# Patient Record
Sex: Female | Born: 1950 | Race: White | Hispanic: No | Smoking: Former smoker
Health system: Southern US, Community
[De-identification: ages and names within clinical notes are randomized; demographics above are authoritative.]

## PROBLEM LIST (undated history)

## (undated) DIAGNOSIS — F329 Major depressive disorder, single episode, unspecified: Secondary | ICD-10-CM

## (undated) DIAGNOSIS — R7303 Prediabetes: Secondary | ICD-10-CM

## (undated) DIAGNOSIS — E039 Hypothyroidism, unspecified: Secondary | ICD-10-CM

## (undated) DIAGNOSIS — D649 Anemia, unspecified: Secondary | ICD-10-CM

## (undated) DIAGNOSIS — F419 Anxiety disorder, unspecified: Secondary | ICD-10-CM

## (undated) DIAGNOSIS — I2510 Atherosclerotic heart disease of native coronary artery without angina pectoris: Secondary | ICD-10-CM

## (undated) DIAGNOSIS — E079 Disorder of thyroid, unspecified: Secondary | ICD-10-CM

## (undated) DIAGNOSIS — J189 Pneumonia, unspecified organism: Secondary | ICD-10-CM

## (undated) DIAGNOSIS — F32A Depression, unspecified: Secondary | ICD-10-CM

## (undated) DIAGNOSIS — I10 Essential (primary) hypertension: Secondary | ICD-10-CM

## (undated) DIAGNOSIS — I719 Aortic aneurysm of unspecified site, without rupture: Secondary | ICD-10-CM

## (undated) DIAGNOSIS — T7840XA Allergy, unspecified, initial encounter: Secondary | ICD-10-CM

## (undated) DIAGNOSIS — E782 Mixed hyperlipidemia: Secondary | ICD-10-CM

## (undated) DIAGNOSIS — R0600 Dyspnea, unspecified: Secondary | ICD-10-CM

## (undated) DIAGNOSIS — M1990 Unspecified osteoarthritis, unspecified site: Secondary | ICD-10-CM

## (undated) DIAGNOSIS — K5792 Diverticulitis of intestine, part unspecified, without perforation or abscess without bleeding: Secondary | ICD-10-CM

## (undated) HISTORY — PX: COLONOSCOPY: SHX174

## (undated) HISTORY — DX: Prediabetes: R73.03

## (undated) HISTORY — DX: Atherosclerotic heart disease of native coronary artery without angina pectoris: I25.10

## (undated) HISTORY — DX: Essential (primary) hypertension: I10

## (undated) HISTORY — DX: Aortic aneurysm of unspecified site, without rupture: I71.9

## (undated) HISTORY — DX: Allergy, unspecified, initial encounter: T78.40XA

## (undated) HISTORY — DX: Hypothyroidism, unspecified: E03.9

## (undated) HISTORY — PX: BACK SURGERY: SHX140

## (undated) HISTORY — DX: Diverticulitis of intestine, part unspecified, without perforation or abscess without bleeding: K57.92

## (undated) HISTORY — PX: TOE SURGERY: SHX1073

## (undated) HISTORY — PX: CARPAL TUNNEL RELEASE: SHX101

## (undated) HISTORY — DX: Mixed hyperlipidemia: E78.2

## (undated) HISTORY — PX: TONSILLECTOMY: SUR1361

## (undated) HISTORY — DX: Disorder of thyroid, unspecified: E07.9

## (undated) HISTORY — PX: OTHER SURGICAL HISTORY: SHX169

---
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
## 2017-05-27 ENCOUNTER — Other Ambulatory Visit: Payer: Self-pay

## 2017-05-27 ENCOUNTER — Encounter: Payer: Self-pay | Admitting: Vascular Surgery

## 2017-05-27 ENCOUNTER — Ambulatory Visit: Payer: Medicare HMO | Admitting: Vascular Surgery

## 2017-05-27 VITALS — BP 124/83 | HR 80 | Temp 97.8°F | Resp 14 | Ht 66.0 in | Wt 202.0 lb

## 2017-05-27 DIAGNOSIS — I714 Abdominal aortic aneurysm, without rupture, unspecified: Secondary | ICD-10-CM

## 2017-05-27 DIAGNOSIS — I713 Abdominal aortic aneurysm, ruptured, unspecified: Secondary | ICD-10-CM

## 2017-05-27 HISTORY — DX: Abdominal aortic aneurysm, without rupture: I71.4

## 2017-05-27 HISTORY — DX: Abdominal aortic aneurysm, without rupture, unspecified: I71.40

## 2017-05-27 NOTE — Progress Notes (Signed)
Patient ID: Cathy KoyanagiMary Haney, female   DOB: [birth year], 67 y.o.   MRN: [id number]  Reason for Consult: New Patient (Initial Visit) (AAA)   Referred by Maris BergerSistasis, Rowena, MD  Subjective:     HPI:  Cathy KoyanagiMary Haney is a 67 y.o. female with history of back pain she has recently had injections.  She also has right upper quadrant pain underwent abdominal ultrasound was found to have a 5 cm aneurysm for which she is now referred.  She does not have any new back or abdominal pain.  She does have a sister with a history of aneurysm and another sister with a cerebral aneurysm.  She is a former smoker for many years having quit this January.  She is able to walk without limitation.  She currently lives alone.  She does not take blood thinners.  She has never had coronary artery disease diagnosed.  Past Medical History:  Diagnosis Date  . Diverticulitis   . Thyroid disease    Family History  Problem Relation Age of Onset  . Heart disease Mother   . Diabetes Brother    Past Surgical History:  Procedure Laterality Date  . BACK SURGERY     khyphoplasty  . CARPAL TUNNEL RELEASE    . COLONOSCOPY      Short Social History:  Social History   Tobacco Use  . Smoking status: Current Every Day Smoker    Types: Cigarettes  . Smokeless tobacco: Never Used  . Tobacco comment: 6 cigs a day  Substance Use Topics  . Alcohol use: No    Frequency: Never    Allergies  Allergen Reactions  . Other     Current Outpatient Medications  Medication Sig Dispense Refill  . albuterol (VENTOLIN HFA) 108 (90 Base) MCG/ACT inhaler Inhale into the lungs every 6 (six) hours as needed for wheezing or shortness of breath.    Marland Kitchen. alendronate (FOSAMAX) 70 MG tablet Take 70 mg by mouth once a week. Take with a full glass of water on an empty stomach.    Marland Kitchen. aspirin EC 81 MG tablet Take 81 mg by mouth daily.    . carvedilol (COREG) 6.25 MG tablet Take 6.25 mg by mouth 2 (two) times daily with a meal.    . fluticasone (FLONASE)  50 MCG/ACT nasal spray Place into both nostrils daily.    Marland Kitchen. gabapentin (NEURONTIN) 300 MG capsule Take 300 mg by mouth 3 (three) times daily.    Marland Kitchen. levothyroxine (SYNTHROID, LEVOTHROID) 125 MCG tablet Take 125 mcg by mouth daily before breakfast.    . lisinopril (PRINIVIL,ZESTRIL) 10 MG tablet Take 10 mg by mouth daily.    . pravastatin (PRAVACHOL) 40 MG tablet Take 40 mg by mouth daily.    . ranitidine (ZANTAC) 150 MG tablet Take 150 mg by mouth 2 (two) times daily.    . hydrOXYzine (ATARAX/VISTARIL) 25 MG tablet Take 25 mg by mouth 3 (three) times daily as needed.    Marland Kitchen. levofloxacin (LEVAQUIN) 750 MG tablet Take 750 mg by mouth daily.    Marland Kitchen. nystatin (MYCOSTATIN) 100000 UNIT/ML suspension Take 5 mLs by mouth 4 (four) times daily.    . predniSONE (DELTASONE) 20 MG tablet Take 20 mg by mouth daily with breakfast.    . triamcinolone acetonide (KENALOG) 10 MG/ML injection Inject 40 mg into the muscle once.     No current facility-administered medications for this visit.     Review of Systems  Constitutional:  Constitutional negative. HENT: HENT negative.  Eyes: Eyes negative.  Respiratory: Respiratory negative.  Cardiovascular: Cardiovascular negative.  GI: Gastrointestinal negative.  Musculoskeletal: Musculoskeletal negative.  Skin: Skin negative.  Neurological: Neurological negative. Hematologic: Hematologic/lymphatic negative.  Psychiatric: Psychiatric negative.        Objective:  Objective   Vitals:   05/27/17 1307  BP: 124/83  Pulse: 80  Resp: 14  Temp: 97.8 F (36.6 C)  TempSrc: Oral  SpO2: 95%  Weight: 202 lb (91.6 kg)  Height: 5\' 6"  (1.676 m)   Body mass index is 32.6 kg/m.  Physical Exam  Constitutional: She is oriented to person, place, and time. She appears well-developed.  HENT:  Head: Normocephalic.  Eyes: Pupils are equal, round, and reactive to light.  Neck: Normal range of motion.  Cardiovascular: Normal rate.  Pulses:      Carotid pulses are 2+ on  the right side, and 2+ on the left side.      Radial pulses are 2+ on the right side, and 2+ on the left side.       Femoral pulses are 2+ on the right side, and 2+ on the left side.      Posterior tibial pulses are 2+ on the right side, and 2+ on the left side.  Abdominal: Soft. She exhibits no mass.  Musculoskeletal: Normal range of motion.  Neurological: She is alert and oriented to person, place, and time.  Skin: Skin is warm and dry.  Psychiatric: She has a normal mood and affect. Her behavior is normal. Judgment and thought content normal.    Data: I reviewed her images of her ultrasound from outside which demonstrates a 5 cm aneurysm at the bifurcation.  There is also an MRI for review of her thoracic spine which demonstrates that her aorta is of normal caliber in the chest.     Assessment/Plan:     67 year old female presenting with a 5 cm distal aortic aneurysm.  She does not have a new back or abdominal pain that we did discuss the signs and symptoms of rupture and the likely outcomes.  She is in good understanding.  We discussed the possible options for repair and the size of which she would require repair being greater than 5 cm where she would transition to a 5-10% yearly risk of rupture.  We discussed the options of open versus endovascular repair and that we will need CT scan prior.  We will get that and also have her evaluated by cardiology and have her follow-up after both.  Should she develop signs or symptoms of rupture she will seek medical attention emergently.     [name] MD [facility]

## 2017-05-30 DIAGNOSIS — K5792 Diverticulitis of intestine, part unspecified, without perforation or abscess without bleeding: Secondary | ICD-10-CM | POA: Insufficient documentation

## 2017-05-30 DIAGNOSIS — I10 Essential (primary) hypertension: Secondary | ICD-10-CM

## 2017-05-30 DIAGNOSIS — E079 Disorder of thyroid, unspecified: Secondary | ICD-10-CM | POA: Insufficient documentation

## 2017-05-30 DIAGNOSIS — I719 Aortic aneurysm of unspecified site, without rupture: Secondary | ICD-10-CM

## 2017-05-30 HISTORY — DX: Essential (primary) hypertension: I10

## 2017-05-30 HISTORY — DX: Aortic aneurysm of unspecified site, without rupture: I71.9

## 2017-06-01 DIAGNOSIS — Z01810 Encounter for preprocedural cardiovascular examination: Secondary | ICD-10-CM | POA: Insufficient documentation

## 2017-06-01 HISTORY — DX: Encounter for preprocedural cardiovascular examination: Z01.810

## 2017-06-01 NOTE — Progress Notes (Signed)
Cardiology Office Note:    Date:  06/02/2017   ID:  Terressa Koyanagi, DOB 15-Mar-1951, MRN [id number]  PCP:  Maris Berger, MD  Cardiologist:  Norman Herrlich, MD   Referring MD: Vicki Mallet*  ASSESSMENT:    1. Preoperative cardiovascular examination   2. Benign essential hypertension   3. Abdominal aortic aneurysm (AAA) without rupture [facility])    PLAN:    Preoperative cardiovascular evaluation summary Surgeon: Dr Randie Heinz Procedure: AAA repair The surgery is elective Active cardiac problems  None.  The cardiac status is stable. The planned procedure is intermediate-EVAR to high-open surgery reepair -risk. The cardiac risk factors are hypertension The functional capacity is 4 mets or greater  5-6 mets limited with back pain Recent cardiac tests performed EKG today is normal Given the above his overall risk for the planned procedure is intermediate if endovascular and high if open surgery Antiplatelet/ anticoagulant recommendation: Stop aspirin perioperatively please tell with patient when to resume Other cardiac medication or device recommendation: None, continue usual cardiac medications perioperatively Anesthesia recommendation: None Observation, monitoring,and postoperative test recommendation: Please placed on monitored bed for the first 24 hours and check EKG postoperative day 1 The patient is optimized from a cardiology perspective: No, pending results of myocardial perfusion study and I will place an addendum to her note  Greater than 50% of the 60 minutes was spent in counseling/coordiantion of care regarding assessing cardiovascular status and perioperative recommendations.  In order of problems listed above:  1. See above myocardial perfusion study be performed unless she has high risk markers proceed with her planned surgery 2. Stable continue current treatment ACE inhibitor and a beta blocker 3. Await results of myocardial perfusion study prior to either  endovascular or open surgical repair 4. Stable continue her current thyroid supplement  Next appointment as needed   Medication Adjustments/Labs and Tests Ordered: Current medicines are reviewed at length with the patient today.  Concerns regarding medicines are outlined above.  Orders Placed This Encounter  Procedures  . EKG 12-Lead   No orders of the defined types were placed in this encounter.    Chief Complaint  Patient presents with  . Hospitalization Follow-up    History of Present Illness:    Cathy Haney is a 67 y.o. female who is being seen today for the evaluation at the request of Vicki Mallet*.  67 year old female presenting with a 5 cm distal aortic aneurysm.  She does not have a new back or abdominal pain that we did discuss the signs and symptoms of rupture and the likely outcomes.  She is in good understanding.  We discussed the possible options for repair and the size of which she would require repair being greater than 5 cm where she would transition to a 5-10% yearly risk of rupture.  We discussed the options of open versus endovascular repair and that we will need CT scan prior.  We will get that and also have her evaluated by cardiology and have her follow-up after both.  Should she develop signs or symptoms of rupture she will seek medical attention emergently. [name] MD [facility] Despite Chronic back pain she remains active she can walk around [place] parking parking lot walking and out without limitation.  She finds herself breathless climbing stairs and has smoked for up to 45 years recently stopping.  She has no cough or bronchospasm she has had no chest pain palpitation or syncope.  Estimated exercise tolerance 6-7  mets.  She relates that decades ago she had a stress test that was normal.  She has had anesthesia in the last year back surgery without complication she has no history of venous  thromboembolism or antibiotic resistant organism infection  Past Medical History:  Diagnosis Date  . Aortic aneurysm of unspecified site, without rupture (HCC) 05/30/2017  . Benign essential hypertension 05/30/2017  . Diverticulitis   . Thyroid disease     Past Surgical History:  Procedure Laterality Date  . BACK SURGERY     khyphoplasty  . CARPAL TUNNEL RELEASE    . COLONOSCOPY      Current Medications: Current Meds  Medication Sig  . albuterol (VENTOLIN HFA) 108 (90 Base) MCG/ACT inhaler Inhale into the lungs every 6 (six) hours as needed for wheezing or shortness of breath.  Marland Kitchen alendronate (FOSAMAX) 70 MG tablet Take 70 mg by mouth once a week. Take with a full glass of water on an empty stomach.  Marland Kitchen aspirin EC 81 MG tablet Take 81 mg by mouth daily.  . carvedilol (COREG) 6.25 MG tablet Take 6.25 mg by mouth 2 (two) times daily with a meal.  . fluticasone (FLONASE) 50 MCG/ACT nasal spray Place into both nostrils daily.  Marland Kitchen gabapentin (NEURONTIN) 300 MG capsule Take 300 mg by mouth 3 (three) times daily.  Marland Kitchen levothyroxine (SYNTHROID, LEVOTHROID) 125 MCG tablet Take 125 mcg by mouth daily before breakfast.  . lisinopril (PRINIVIL,ZESTRIL) 10 MG tablet Take 10 mg by mouth daily.  . pravastatin (PRAVACHOL) 40 MG tablet Take 40 mg by mouth daily.  . ranitidine (ZANTAC) 150 MG tablet Take 150 mg by mouth 2 (two) times daily.     Allergies:   Other   Social History   Socioeconomic History  . Marital status: Widowed    Spouse name: None  . Number of children: None  . Years of education: None  . Highest education level: None  Social Needs  . Financial resource strain: None  . Food insecurity - worry: None  . Food insecurity - inability: None  . Transportation needs - medical: None  . Transportation needs - non-medical: None  Occupational History  . None  Tobacco Use  . Smoking status: Former Smoker    Types: Cigarettes  . Smokeless tobacco: Never Used  . Tobacco comment: 6  cigs a day  Substance and Sexual Activity  . Alcohol use: No    Frequency: Never  . Drug use: No  . Sexual activity: No  Other Topics Concern  . None  Social History Narrative  . None     Family History: The patient's family history includes Diabetes in her brother; Heart disease in her mother.  ROS:   Review of Systems  Constitution: Negative.  HENT: Negative.   Eyes: Negative.   Cardiovascular: Negative.   Respiratory: Positive for shortness of breath (on stairs or incline).   Endocrine: Negative.   Hematologic/Lymphatic: Negative.   Skin: Negative.   Musculoskeletal: Positive for back pain.  Gastrointestinal: Negative.   Genitourinary: Negative.   Neurological: Negative.   Psychiatric/Behavioral: Negative.   Allergic/Immunologic: Negative.    Please see the history of present illness.     All other systems reviewed and are negative.  EKGs/Labs/Other Studies Reviewed:    The following studies were reviewed today:   EKG:  EKG is  ordered today.  The ekg ordered today demonstrates SRTH normal  Recent Labs: 11/28 18: Chol 167, HDL 43 LDL 92 CMP CBC T4 normal A1C  6.9 No results found for requested labs within last 8760 hours.  Recent Lipid Panel No results found for: CHOL, TRIG, HDL, CHOLHDL, VLDL, LDLCALC, LDLDIRECT  Physical Exam:    VS:  BP 140/90 (BP Location: Left Arm)   Pulse 76   Ht $RemoveBeforeD [id number]\' 6"   BMI 33.15 kg/m     Wt Readings from Last 3 Encounters:  06/02/17 205 lb 6.4 oz (93.2 kg)  05/27/17 202 lb (91.6 kg)     GEN:  Well nourished, well developed in no acute distress HEENT: Normal NECK: No JVD; No carotid bruits LYMPHATICS: No lymphadenopathy CARDIAC: RRR, no murmurs, rubs, gallops RESPIRATORY:  Clear to auscultation without rales, wheezing or rhonchi  ABDOMEN: Soft, non-tender, non-distended MUSCULOSKELETAL:  No edema; No deformity  SKIN: Warm and dry NEUROLOGIC:  Alert and oriented x  3 PSYCHIATRIC:  Normal affect   Signed, Norman HerrlichBrian Munley, MD  06/02/2017 11:59 AM    [facility]

## 2017-06-02 ENCOUNTER — Encounter: Payer: Self-pay | Admitting: Cardiology

## 2017-06-02 ENCOUNTER — Ambulatory Visit (INDEPENDENT_AMBULATORY_CARE_PROVIDER_SITE_OTHER): Payer: Medicare HMO | Admitting: Cardiology

## 2017-06-02 VITALS — BP 140/90 | HR 76 | Ht 66.0 in | Wt 205.4 lb

## 2017-06-02 DIAGNOSIS — I10 Essential (primary) hypertension: Secondary | ICD-10-CM | POA: Diagnosis not present

## 2017-06-02 DIAGNOSIS — Z01810 Encounter for preprocedural cardiovascular examination: Secondary | ICD-10-CM | POA: Diagnosis not present

## 2017-06-02 DIAGNOSIS — E079 Disorder of thyroid, unspecified: Secondary | ICD-10-CM

## 2017-06-02 DIAGNOSIS — I714 Abdominal aortic aneurysm, without rupture, unspecified: Secondary | ICD-10-CM

## 2017-06-02 NOTE — Patient Instructions (Signed)
Medication Instructions:  Your physician recommends that you continue on your current medications as directed. Please refer to the Current Medication list given to you today.  Labwork: None  Testing/Procedures: You had an EKG today.  Your physician has requested that you have a lexiscan myoview. For further information please visit [email or web address]. Please follow instruction sheet, as given.  Follow-Up: Your physician recommends that you schedule a follow-up appointment as needed if symptoms worsen or fail to improve.  Any Other Special Instructions Will Be Listed Below (If Applicable).     If you need a refill on your cardiac medications before your next appointment, please call your pharmacy.   

## 2017-06-06 ENCOUNTER — Encounter (HOSPITAL_COMMUNITY): Payer: Medicare HMO

## 2017-06-08 ENCOUNTER — Telehealth (HOSPITAL_COMMUNITY): Payer: Self-pay | Admitting: *Deleted

## 2017-06-08 NOTE — Telephone Encounter (Signed)
Left message on voicemail per DPR in reference to upcoming appointment scheduled on 06/13/17 with detailed instructions given per Myocardial Perfusion Study Information Sheet for the test. LM to arrive 15 minutes early, and that it is imperative to arrive on time for appointment to keep from having the test rescheduled. If you need to cancel or reschedule your appointment, please call the office within 24 hours of your appointment. Failure to do so may result in a cancellation of your appointment, and a $50 no show fee. Phone number given for call back for any questions. Cerise Lieber, Darla Jacqueline    

## 2017-06-13 ENCOUNTER — Ambulatory Visit (HOSPITAL_COMMUNITY): Payer: Medicare HMO | Attending: Cardiovascular Disease

## 2017-06-13 DIAGNOSIS — Z01810 Encounter for preprocedural cardiovascular examination: Secondary | ICD-10-CM | POA: Insufficient documentation

## 2017-06-13 DIAGNOSIS — I10 Essential (primary) hypertension: Secondary | ICD-10-CM | POA: Insufficient documentation

## 2017-06-13 DIAGNOSIS — I714 Abdominal aortic aneurysm, without rupture, unspecified: Secondary | ICD-10-CM

## 2017-06-13 LAB — MYOCARDIAL PERFUSION IMAGING
CHL CUP RESTING HR STRESS: 72 {beats}/min
CSEPPHR: 94 {beats}/min
LV dias vol: 77 mL (ref 46–106)
LVSYSVOL: 25 mL
RATE: 0.31
SDS: 1
SRS: 4
SSS: 5
TID: 0.97

## 2017-06-13 MED ORDER — REGADENOSON 0.4 MG/5ML IV SOLN
0.4000 mg | Freq: Once | INTRAVENOUS | Status: AC
  Administered 2017-06-13: 0.4 mg via INTRAVENOUS

## 2017-06-13 MED ORDER — TECHNETIUM TC 99M TETROFOSMIN IV KIT
10.6000 | PACK | Freq: Once | INTRAVENOUS | Status: AC | PRN
  Administered 2017-06-13: 10.6 via INTRAVENOUS
  Filled 2017-06-13: qty 11

## 2017-06-13 MED ORDER — TECHNETIUM TC 99M TETROFOSMIN IV KIT
31.0000 | PACK | Freq: Once | INTRAVENOUS | Status: AC | PRN
  Administered 2017-06-13: 31 via INTRAVENOUS
  Filled 2017-06-13: qty 31

## 2017-06-17 ENCOUNTER — Other Ambulatory Visit: Payer: Self-pay

## 2017-06-17 ENCOUNTER — Encounter: Payer: Self-pay | Admitting: Vascular Surgery

## 2017-06-17 ENCOUNTER — Ambulatory Visit (INDEPENDENT_AMBULATORY_CARE_PROVIDER_SITE_OTHER): Payer: Medicare HMO | Admitting: Vascular Surgery

## 2017-06-17 ENCOUNTER — Ambulatory Visit
Admission: RE | Admit: 2017-06-17 | Discharge: 2017-06-17 | Disposition: A | Discharge status: Home / Self Care | Payer: Medicare HMO | Source: Ambulatory Visit | Attending: Vascular Surgery | Admitting: Vascular Surgery

## 2017-06-17 VITALS — BP 160/87 | HR 84 | Resp 18 | Ht 66.0 in | Wt 206.0 lb

## 2017-06-17 DIAGNOSIS — I714 Abdominal aortic aneurysm, without rupture, unspecified: Secondary | ICD-10-CM

## 2017-06-17 DIAGNOSIS — I713 Abdominal aortic aneurysm, ruptured, unspecified: Secondary | ICD-10-CM

## 2017-06-17 MED ORDER — IOPAMIDOL (ISOVUE-370) INJECTION 76%
75.0000 mL | Freq: Once | INTRAVENOUS | Status: AC | PRN
  Administered 2017-06-17: 75 mL via INTRAVENOUS

## 2017-06-17 NOTE — Progress Notes (Signed)
Patient ID: Cathy KoyanagiMary Haney, female   DOB: [birth year], 67 y.o.   MRN: [id number]  Reason for Consult: Follow-up (CT w/ cardiology clearance)   Referred by Maris BergerSistasis, Rowena, MD  Subjective:     HPI:  Cathy Haney is a 67 y.o. female whom I recently saw for 5 cm aneurysm.  She does not have any back or abdominal pain.  She remains a non-smoker and she does continue to walk without limitation.  She had CT scan and cardiac evaluation prior to today's visit.  Past Medical History:  Diagnosis Date  . Aortic aneurysm of unspecified site, without rupture (HCC) 05/30/2017  . Benign essential hypertension 05/30/2017  . Diverticulitis   . Thyroid disease    Family History  Problem Relation Age of Onset  . Heart disease Mother   . Diabetes Brother   . Heart disease Brother    Past Surgical History:  Procedure Laterality Date  . BACK SURGERY     khyphoplasty  . CARPAL TUNNEL RELEASE    . COLONOSCOPY      Short Social History:  Social History   Tobacco Use  . Smoking status: Former Smoker    Types: Cigarettes  . Smokeless tobacco: Never Used  . Tobacco comment: 6 cigs a day  Substance Use Topics  . Alcohol use: No    Frequency: Never    Allergies  Allergen Reactions  . Other     Current Outpatient Medications  Medication Sig Dispense Refill  . albuterol (VENTOLIN HFA) 108 (90 Base) MCG/ACT inhaler Inhale into the lungs every 6 (six) hours as needed for wheezing or shortness of breath.    Marland Kitchen. alendronate (FOSAMAX) 70 MG tablet Take 70 mg by mouth once a week. Take with a full glass of water on an empty stomach.    Marland Kitchen. aspirin EC 81 MG tablet Take 81 mg by mouth daily.    . carvedilol (COREG) 6.25 MG tablet Take 6.25 mg by mouth 2 (two) times daily with a meal.    . fluticasone (FLONASE) 50 MCG/ACT nasal spray Place into both nostrils daily.    Marland Kitchen. gabapentin (NEURONTIN) 300 MG capsule Take 300 mg by mouth 3 (three) times daily.    Marland Kitchen. levothyroxine (SYNTHROID, LEVOTHROID) 125 MCG tablet  Take 125 mcg by mouth daily before breakfast.    . lisinopril (PRINIVIL,ZESTRIL) 10 MG tablet Take 10 mg by mouth daily.    . pravastatin (PRAVACHOL) 40 MG tablet Take 40 mg by mouth daily.    . ranitidine (ZANTAC) 150 MG tablet Take 150 mg by mouth 2 (two) times daily.     No current facility-administered medications for this visit.     Review of Systems  Constitutional:  Constitutional negative. HENT: HENT negative.  Eyes: Eyes negative.  Cardiovascular: Cardiovascular negative.  GI: Gastrointestinal negative.  Skin: Skin negative.  Neurological: Neurological negative. Hematologic: Hematologic/lymphatic negative.  Psychiatric: Psychiatric negative.        Objective:  Objective   Vitals:   06/17/17 1521 06/17/17 1522  BP: (!) 164/88 (!) 160/87  Pulse: 84   Resp: 18   SpO2: 96%   Weight: 206 lb (93.4 kg)   Height: 5\' 6"  (1.676 m)    Body mass index is 33.25 kg/m.  Physical Exam  Constitutional: She is oriented to person, place, and time. She appears well-developed.  HENT:  Head: Normocephalic.  Cardiovascular: Normal rate.  Pulses:      Radial pulses are 2+ on the right side, and 2+ on the  left side.       Popliteal pulses are 2+ on the right side, and 2+ on the left side.  Pulmonary/Chest: Effort normal.  Abdominal: Soft.  Neurological: She is alert and oriented to person, place, and time.  Skin: Skin is warm and dry.  Psychiatric: She has a normal mood and affect. Her behavior is normal. Judgment and thought content normal.    Data: IMPRESSION: VASCULAR  Abdominal aortic aneurysm is present with a maximal diameter of 4.7 cm. Recommend followup by abdomen and pelvis CTA in 6 months, and vascular surgery referral/consultation if not already obtained. This recommendation follows ACR consensus guidelines: White Paper of the ACR Incidental Findings Committee II on Vascular Findings. J Am Coll Radiol 2013; 10:789-794.     Assessment/Plan:     67 year old  female follows up for surgical planning of abdominal aortic aneurysm.  Fortunately her CT angios demonstrated her aorta to clearly be less than 5 cm as it was demonstrated to be greater than 5 cm on our duplex.  She did have cardiac testing and cleared for surgery but at this time she will need follow-up in 6 months and no surgery. Given the incongruent studies we will continue to get CT scan to follow this at least in 6 months from now.  After that we may go back to duplex if it is stable.  She demonstrates good understanding.  Should she have new back or abdominal pain she certainly needs to seek medical evaluation.     [name] MD [facility]

## 2017-06-20 ENCOUNTER — Other Ambulatory Visit: Payer: Self-pay

## 2017-06-20 DIAGNOSIS — I714 Abdominal aortic aneurysm, without rupture, unspecified: Secondary | ICD-10-CM

## 2017-12-23 ENCOUNTER — Other Ambulatory Visit: Payer: Medicare HMO

## 2017-12-23 ENCOUNTER — Ambulatory Visit: Payer: Medicare HMO | Admitting: Vascular Surgery

## 2018-01-13 ENCOUNTER — Ambulatory Visit
Admission: RE | Admit: 2018-01-13 | Discharge: 2018-01-13 | Disposition: A | Discharge status: Home / Self Care | Payer: Medicare HMO | Source: Ambulatory Visit | Attending: Vascular Surgery | Admitting: Vascular Surgery

## 2018-01-13 ENCOUNTER — Ambulatory Visit (INDEPENDENT_AMBULATORY_CARE_PROVIDER_SITE_OTHER): Payer: Medicare HMO | Admitting: Vascular Surgery

## 2018-01-13 ENCOUNTER — Other Ambulatory Visit: Payer: Self-pay

## 2018-01-13 ENCOUNTER — Encounter: Payer: Self-pay | Admitting: Vascular Surgery

## 2018-01-13 VITALS — BP 126/80 | HR 70 | Resp 18 | Ht 66.0 in | Wt 198.0 lb

## 2018-01-13 DIAGNOSIS — I714 Abdominal aortic aneurysm, without rupture, unspecified: Secondary | ICD-10-CM

## 2018-01-13 MED ORDER — IOPAMIDOL (ISOVUE-370) INJECTION 76%
75.0000 mL | Freq: Once | INTRAVENOUS | Status: AC | PRN
  Administered 2018-01-13: 75 mL via INTRAVENOUS

## 2018-01-13 NOTE — Progress Notes (Signed)
Patient ID: Cathy Haney, female   DOB: 1951/03/22, 67 y.o.   MRN: [id number]  Reason for Consult: Follow-up (6 month f/u )   Referred by Cathy Berger, MD  Subjective:     HPI:  Cathy Haney is a 66 y.o. female with known abdominal aortic aneurysm.  Initially was thought to be 5 cm but CT scan demonstrated 4.7 cm.  She does have a family history of abdominal aortic aneurysm and is also a former smoker.  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  She is being considered for colonoscopy and also for injections in her back and is not considering back surgery at this time.  Past Medical History:  Diagnosis Date  . Aortic aneurysm of unspecified site, without rupture (HCC) 05/30/2017  . Benign essential hypertension 05/30/2017  . Diverticulitis   . Thyroid disease    Family History  Problem Relation Age of Onset  . Heart disease Mother   . Diabetes Brother   . Heart disease Brother    Past Surgical History:  Procedure Laterality Date  . BACK SURGERY     khyphoplasty  . CARPAL TUNNEL RELEASE    . COLONOSCOPY      Short Social History:  Social History   Tobacco Use  . Smoking status: Former Smoker    Types: Cigarettes  . Smokeless tobacco: Never Used  . Tobacco comment: 6 cigs a day  Substance Use Topics  . Alcohol use: No    Frequency: Never    Allergies  Allergen Reactions  . Other     Current Outpatient Medications  Medication Sig Dispense Refill  . albuterol (VENTOLIN HFA) 108 (90 Base) MCG/ACT inhaler Inhale into the lungs every 6 (six) hours as needed for wheezing or shortness of breath.    Marland Kitchen alendronate (FOSAMAX) 70 MG tablet Take 70 mg by mouth once a week. Take with a full glass of water on an empty stomach.    Marland Kitchen aspirin EC 81 MG tablet Take 81 mg by mouth daily.    . carvedilol (COREG) 6.25 MG tablet Take 6.25 mg by mouth 2 (two) times daily with a meal.    . fluticasone (FLONASE) 50 MCG/ACT  nasal spray Place into both nostrils daily.    Marland Kitchen gabapentin (NEURONTIN) 300 MG capsule Take 300 mg by mouth 3 (three) times daily.    Marland Kitchen levothyroxine (SYNTHROID, LEVOTHROID) 125 MCG tablet Take 125 mcg by mouth daily before breakfast.    . lisinopril (PRINIVIL,ZESTRIL) 10 MG tablet Take 10 mg by mouth daily.    . pravastatin (PRAVACHOL) 40 MG tablet Take 40 mg by mouth daily.    . ranitidine (ZANTAC) 150 MG tablet Take 150 mg by mouth 2 (two) times daily.     No current facility-administered medications for this visit.     Review of Systems  Constitutional:  Constitutional negative. HENT: HENT negative.  Eyes: Eyes negative.  Respiratory: Respiratory negative.  Cardiovascular: Cardiovascular negative.  GI: Gastrointestinal negative.  Musculoskeletal: Positive for back pain, leg pain and joint pain.  Skin: Skin negative.  Neurological: Positive for dizziness and focal weakness.  Hematologic: Hematologic/lymphatic negative.  Psychiatric: Psychiatric negative.        Objective:  Objective   Vitals:   01/13/18 1434  BP: 126/80  Pulse: 70  Resp: 18  SpO2: 95%  Weight: 198 lb (89.8 kg)  Height: 5\' 6"  (1.676 m)   Body mass index is  31.96 kg/m.  Physical Exam  Constitutional: She is oriented to person, place, and time. She appears well-developed.  HENT:  Head: Normocephalic.  Eyes: Pupils are equal, round, and reactive to light.  Neck: Normal range of motion. Neck supple.  Cardiovascular: Normal rate.  Pulses:      Radial pulses are 2+ on the right side, and 2+ on the left side.  Abdominal: Soft. She exhibits no mass.  Musculoskeletal: Normal range of motion.  Neurological: She is alert and oriented to person, place, and time.  Skin: Skin is warm and dry. Capillary refill takes less than 2 seconds.  Psychiatric: She has a normal mood and affect. Her behavior is normal. Judgment and thought content normal.    Data: I reviewed her CT scan with her temperature is 4.8 cm  AAA     Assessment/Plan:     67 year old female follows up with CT scan to evaluate her aneurysm.  It is been stable over the past 6 months.  She will follow-up in 6 months with another arterial duplex.  We discussed the signs and symptoms of rupture for which she would seek emergent medical therapy and she demonstrates good understanding.     Cathy Harman MD [facility]

## 2018-07-13 ENCOUNTER — Other Ambulatory Visit: Payer: Self-pay

## 2018-07-13 DIAGNOSIS — I714 Abdominal aortic aneurysm, without rupture, unspecified: Secondary | ICD-10-CM

## 2018-07-28 ENCOUNTER — Ambulatory Visit: Payer: Medicare HMO | Admitting: Family

## 2018-07-28 ENCOUNTER — Other Ambulatory Visit (HOSPITAL_COMMUNITY): Payer: Medicare HMO

## 2019-04-03 ENCOUNTER — Telehealth: Payer: Self-pay | Admitting: Vascular Surgery
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
## 2019-04-05 ENCOUNTER — Other Ambulatory Visit: Payer: Self-pay

## 2019-04-05 DIAGNOSIS — I714 Abdominal aortic aneurysm, without rupture, unspecified: Secondary | ICD-10-CM

## 2019-04-06 ENCOUNTER — Ambulatory Visit (HOSPITAL_COMMUNITY): Admission: RE | Admit: 2019-04-06 | Payer: Medicare Other | Source: Ambulatory Visit

## 2019-04-06 ENCOUNTER — Ambulatory Visit: Payer: Medicare HMO | Admitting: Vascular Surgery

## 2019-05-18 ENCOUNTER — Ambulatory Visit (HOSPITAL_COMMUNITY): Payer: Medicare Other

## 2019-05-18 ENCOUNTER — Ambulatory Visit: Payer: Medicare Other | Admitting: Vascular Surgery

## 2019-05-24 ENCOUNTER — Encounter: Payer: Self-pay | Admitting: Vascular Surgery

## 2019-06-14 ENCOUNTER — Telehealth (HOSPITAL_COMMUNITY): Payer: Self-pay

## 2019-06-14 ENCOUNTER — Other Ambulatory Visit: Payer: Self-pay

## 2019-06-14 NOTE — Telephone Encounter (Signed)

## 2019-06-15 ENCOUNTER — Ambulatory Visit (INDEPENDENT_AMBULATORY_CARE_PROVIDER_SITE_OTHER): Payer: Medicare Other | Admitting: Vascular Surgery

## 2019-06-15 ENCOUNTER — Telehealth: Payer: Self-pay

## 2019-06-15 ENCOUNTER — Other Ambulatory Visit: Payer: Self-pay

## 2019-06-15 ENCOUNTER — Ambulatory Visit (HOSPITAL_COMMUNITY)
Admission: RE | Admit: 2019-06-15 | Discharge: 2019-06-15 | Disposition: A | Discharge status: Home / Self Care | Payer: Medicare Other | Source: Ambulatory Visit | Attending: Vascular Surgery | Admitting: Vascular Surgery

## 2019-06-15 ENCOUNTER — Telehealth: Payer: Self-pay | Admitting: Cardiology

## 2019-06-15 ENCOUNTER — Encounter: Payer: Self-pay | Admitting: Vascular Surgery

## 2019-06-15 VITALS — BP 163/77 | HR 77 | Temp 97.7°F | Resp 20 | Ht 66.0 in | Wt 200.0 lb

## 2019-06-15 DIAGNOSIS — I714 Abdominal aortic aneurysm, without rupture, unspecified: Secondary | ICD-10-CM

## 2019-06-15 NOTE — Telephone Encounter (Signed)
   [facility] Pre-operative Risk Assessment    Request for surgical clearance:  1. What type of surgery is being performed? AAA Repair  2. When is this surgery scheduled? 07-03-19  3. What type of clearance is required (medical clearance vs. Pharmacy clearance to hold med vs. Both)? Up to Cardiology  4. Are there any medications that need to be held prior to surgery and how long? Up To Cardiology    5. Practice name and name of physician performing surgery? Dr. Donzetta Mat[facility]   6. What is your office phone number: [phone]    7.   What is your office fax number: [phone]  8.   Anesthesia type (None, local, MAC, general) ? Not sure   Pt will need to be seen by Dr. Bettina Gavia or one of his colleagues ASAP for surgical clearance    Johnna Acosta 06/15/2019, 11:27 AM  _________________________________________________________________   (provider comments below)

## 2019-06-15 NOTE — Telephone Encounter (Signed)
Spoke to pt to let her know surgery date has been changed to 4/8 at 0530 arrival time. Pt has no questions/concerns at this time.
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
## 2019-06-15 NOTE — Telephone Encounter (Signed)
Please call pt to schedule an appt with Dr. Dulce Sellar in [facility] or APP for preoperative clearance.

## 2019-06-15 NOTE — Telephone Encounter (Signed)
   Primary Cardiologist: Dr Dulce Sellar  Chart reviewed as part of pre-operative protocol coverage. Because of Cathy Haney past medical history and time since last visit, (March 2019) she will require a follow-up visit in order to better assess preoperative cardiovascular risk.  Pre-op covering staff: - Please schedule appointment with Dr Dulce Sellar or an APP and call patient to inform them.  Schedule her office visit next week if possible- her surgery is scheduled for 4/06.   - Please contact requesting surgeon's office via preferred method (i.e, phone, fax) to inform them of need for appointment prior to surgery.  If applicable, this message will also be routed to pharmacy pool and/or primary cardiologist for input on holding anticoagulant/antiplatelet agent as requested below so that this information is available at time of patient's appointment.   Corine Shelter, PA-C  06/15/2019, 1:55 PM

## 2019-06-15 NOTE — Telephone Encounter (Signed)
Faxed successfully to [facility] via Epic fax function.

## 2019-06-18 NOTE — Telephone Encounter (Signed)
Pt has been made aware she will need appt for pre op since we have note seen pt since 2019. Pt wanted to be seen in the [place] office. Pt is scheduled to see Dr. Servando Salina 06/19/19 @ 1:15 for pre op. I will send clearance notes to Dr. Servando Salina. I will send FYI to Dr. Randie Heinz with [facility] pt has upcoming appt 06/19/19. I will remove from the pre op call back pool.

## 2019-06-18 NOTE — Telephone Encounter (Signed)
Pt would like for her sister Cathy Haney to be called in on the call. Glenda's cell # is [phone].

## 2019-06-18 NOTE — Telephone Encounter (Signed)
Thank you Dr. Servando Salina for your help.

## 2019-06-18 NOTE — Telephone Encounter (Signed)
Thanks Carol.

## 2019-06-19 ENCOUNTER — Ambulatory Visit (INDEPENDENT_AMBULATORY_CARE_PROVIDER_SITE_OTHER): Payer: Medicare Other | Admitting: Cardiology

## 2019-06-19 ENCOUNTER — Other Ambulatory Visit: Payer: Self-pay

## 2019-06-19 ENCOUNTER — Encounter: Payer: Self-pay | Admitting: Cardiology

## 2019-06-19 ENCOUNTER — Telehealth (HOSPITAL_COMMUNITY): Payer: Self-pay | Admitting: Emergency Medicine

## 2019-06-19 VITALS — BP 112/72 | HR 75 | Ht 66.0 in | Wt 201.0 lb

## 2019-06-19 DIAGNOSIS — I714 Abdominal aortic aneurysm, without rupture, unspecified: Secondary | ICD-10-CM

## 2019-06-19 DIAGNOSIS — E669 Obesity, unspecified: Secondary | ICD-10-CM

## 2019-06-19 DIAGNOSIS — R7303 Prediabetes: Secondary | ICD-10-CM

## 2019-06-19 DIAGNOSIS — Z01810 Encounter for preprocedural cardiovascular examination: Secondary | ICD-10-CM

## 2019-06-19 DIAGNOSIS — I10 Essential (primary) hypertension: Secondary | ICD-10-CM | POA: Diagnosis not present

## 2019-06-19 DIAGNOSIS — R0602 Shortness of breath: Secondary | ICD-10-CM

## 2019-06-19 DIAGNOSIS — E782 Mixed hyperlipidemia: Secondary | ICD-10-CM

## 2019-06-19 HISTORY — DX: Abdominal aortic aneurysm, without rupture, unspecified: I71.40

## 2019-06-19 HISTORY — DX: Abdominal aortic aneurysm, without rupture: I71.4

## 2019-06-19 HISTORY — DX: Obesity, unspecified: E66.9

## 2019-06-19 HISTORY — DX: Shortness of breath: R06.02

## 2019-06-19 NOTE — Progress Notes (Signed)
Cardiology Office Note:    Date:  06/19/2019   ID:  Cathy Haney, DOB 17-Mar-1951, MRN [id number]  PCP:  Maris Berger, MD  Cardiologist:  Norman Herrlich, MD  Electrophysiologist:  None   Referring MD: Maris Berger, MD   The patient is her for a follow up visit.  History of Present Illness:    Cathy Haney is a 69 y.o. female with a hx of hypertension, hyperlipidemia, prediabetes who presents today for cardiovascular occurrence for her procedure for her planned abdominal aortic endovascular stent procedure.  Today the patient tells me that she is experiencing significant shortness of breath on exertion.  And has not been able to do most of her activities because of this.  Denies any chest pain.    Past Medical History:  Diagnosis Date  . Allergy   . Aortic aneurysm of unspecified site, without rupture (HCC) 05/30/2017  . Benign essential hypertension 05/30/2017  . Diverticulitis   . Hypothyroidism    Aquired  . Mixed hyperlipidemia   . Prediabetes   . Thyroid disease     Past Surgical History:  Procedure Laterality Date  . BACK SURGERY     khyphoplasty  . CARPAL TUNNEL RELEASE    . COLONOSCOPY    . EXCISION OF SKIN LESION      Current Medications: Current Meds  Medication Sig  . albuterol (VENTOLIN HFA) 108 (90 Base) MCG/ACT inhaler Inhale into the lungs as needed for wheezing or shortness of breath.   Marland Kitchen alendronate (FOSAMAX) 70 MG tablet Take 70 mg by mouth once a week. Take with a full glass of water on an empty stomach.  . Ascorbic Acid (VITAMIN C WITH ROSE HIPS) 250 MG tablet Take 500 mg by mouth daily.  . carvedilol (COREG) 6.25 MG tablet Take 6.25 mg by mouth 2 (two) times daily with a meal. Takes 2 tabs in the am and 1 tab in the evening  . Cholecalciferol (VITAMIN D-3 PO) Take by mouth.  . gabapentin (NEURONTIN) 300 MG capsule Take 300 mg by mouth 5 (five) times daily.   Marland Kitchen glucose blood test strip Accu-Chek Aviva Plus test strips  Use to check BS daily.  Marland Kitchen  levothyroxine (SYNTHROID, LEVOTHROID) 125 MCG tablet Take 125 mcg by mouth daily before breakfast.  . lisinopril (PRINIVIL,ZESTRIL) 10 MG tablet Take 10 mg by mouth daily.  . Multiple Vitamin (MULTIVITAMIN) capsule Take 1 capsule by mouth daily.  . Multiple Vitamins-Minerals (ICAPS AREDS 2 PO) Take by mouth.  . Omega-3 Fatty Acids (FISH OIL) 1000 MG CAPS Take 2,000 mg by mouth daily.   . pravastatin (PRAVACHOL) 40 MG tablet Take 40 mg by mouth daily.     Allergies:   Other   Social History   Socioeconomic History  . Marital status: Widowed    Spouse name: Not on file  . Number of children: Not on file  . Years of education: Not on file  . Highest education level: Not on file  Occupational History  . Not on file  Tobacco Use  . Smoking status: Former Smoker    Types: Cigarettes  . Smokeless tobacco: Never Used  . Tobacco comment: 6 cigs a day  Substance and Sexual Activity  . Alcohol use: No  . Drug use: No  . Sexual activity: Never  Other Topics Concern  . Not on file  Social History Narrative  . Not on file   Social Determinants of Health   Financial Resource Strain:   . Difficulty of Paying  Living Expenses:   Food Insecurity:   . Worried About Programme researcher, broadcasting/film/videounning Out of Food in the Last Year:   . Baristaan Out of Food in the Last Year:   Transportation Needs:   . Freight forwarderLack of Transportation (Medical):   Marland Kitchen. Lack of Transportation (Non-Medical):   Physical Activity:   . Days of Exercise per Week:   . Minutes of Exercise per Session:   Stress:   . Feeling of Stress :   Social Connections:   . Frequency of Communication with Friends and Family:   . Frequency of Social Gatherings with Friends and Family:   . Attends Religious Services:   . Active Member of Clubs or Organizations:   . Attends BankerClub or Organization Meetings:   Marland Kitchen. Marital Status:      Family History: The patient's family history includes Colon cancer in her sister; Diabetes in her brother; Heart disease in her brother and  mother.  ROS:   Review of Systems  Constitution: Negative for decreased appetite, fever and weight gain.  HENT: Negative for congestion, ear discharge, hoarse voice and sore throat.   Eyes: Negative for discharge, redness, vision loss in right eye and visual halos.  Cardiovascular: Negative for chest pain, dyspnea on exertion, leg swelling, orthopnea and palpitations.  Respiratory: Negative for cough, hemoptysis, shortness of breath and snoring.   Endocrine: Negative for heat intolerance and polyphagia.  Hematologic/Lymphatic: Negative for bleeding problem. Does not bruise/bleed easily.  Skin: Negative for flushing, nail changes, rash and suspicious lesions.  Musculoskeletal: Negative for arthritis, joint pain, muscle cramps, myalgias, neck pain and stiffness.  Gastrointestinal: Negative for abdominal pain, bowel incontinence, diarrhea and excessive appetite.  Genitourinary: Negative for decreased libido, genital sores and incomplete emptying.  Neurological: Negative for brief paralysis, focal weakness, headaches and loss of balance.  Psychiatric/Behavioral: Negative for altered mental status, depression and suicidal ideas.  Allergic/Immunologic: Negative for HIV exposure and persistent infections.    EKGs/Labs/Other Studies Reviewed:    The following studies were reviewed today:   EKG:  The ekg ordered today demonstrates sinus rhythm, heart rate 75 bpm, nonspecific ST changes.  CTA chest findings April 03, 2019: Interval enlargement of the fusiform infrarenal abdominal aortic aneurysm now measuring up to 5.4 cm in maximal transaxial dimension previously 4.7 cm.  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  Aortic sclerosis.  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  Emphysema was noted, prior T6 vertebroplasty.   Unchanged compression deformities of T12, L1, L3.  Mild focal pericardial thickening near the cardiac apex unchanged from comparison.  Myocardial perfusion testing in in 2019 was reported as normal low risk nuclear study.  Recent Labs: No results found for requested labs within last 8760 hours.  Recent Lipid Panel No results found for: CHOL, TRIG, HDL, CHOLHDL, VLDL, LDLCALC, LDLDIRECT  Physical Exam:    VS:  BP 112/72 (BP Location: Right Arm, Patient Position: Sitting, Cuff Size: Normal)   Pulse 75   Ht 5\' 6"  (1.676 m)   Wt 201 lb (91.2 kg)   SpO2 93%   BMI 32.44 kg/m     Wt Readings from Last 3 Encounters:  06/19/19 201 lb (91.2 kg)  06/15/19 200 lb (90.7 kg)  01/13/18 198 lb (89.8 kg)     GEN: Well nourished, well developed in no acute distress HEENT: Normal NECK: No JVD;  No carotid bruits LYMPHATICS: No lymphadenopathy CARDIAC: S1S2 noted,RRR, no murmurs, rubs, gallops RESPIRATORY:  Clear to auscultation without rales, wheezing or rhonchi  ABDOMEN: Soft, non-tender, non-distended, +bowel sounds, no guarding. EXTREMITIES: No edema, No cyanosis, no clubbing MUSCULOSKELETAL:  No deformity  SKIN: Warm and dry NEUROLOGIC:  Alert and oriented x 3, non-focal PSYCHIATRIC:  Normal affect, good insight  ASSESSMENT:    1. Shortness of breath   2. Benign essential hypertension   3. Preoperative cardiovascular examination   4. Mixed hyperlipidemia   5. Prediabetes   6. Obesity (BMI 30-39.9)   7. AAA (abdominal aortic aneurysm) without rupture (HCC)    PLAN:     Given the patient shortness of breath which could be an anginal equivalent and her high risk factors, in the setting of her preprocedure clearance for her vascular surgery I like to proceed with ischemic work-up.  A coronary CTA will be appropriate at this time.  The patient has been educated on this testing.  She is agreeable to proceed.  We have been able to schedule the patient for tomorrow at 4:45 PM.  Lab work  will be done today. If she CTA does not suggest further testing, she may proceed with her procedure.   Hypertension-continue her current medication regimen, blood pressure in the office is acceptable today.  Hyperlipidemia continue patient on pravastatin.  Prediabetes-she denies being on any medications for her diabetes.  Management per PCP.  The patient is in agreement with the above plan. The patient left the office in stable condition.  The patient will follow up in 3 months with Dr. Bettina Gavia   Medication Adjustments/Labs and Tests Ordered: Current medicines are reviewed at length with the patient today.  Concerns regarding medicines are outlined above.  Orders Placed This Encounter  Procedures  . CT CORONARY MORPH W/CTA COR W/SCORE W/CA W/CM &/OR WO/CM  . CT CORONARY FRACTIONAL FLOW RESERVE DATA PREP  . CT CORONARY FRACTIONAL FLOW RESERVE FLUID ANALYSIS  . Basic metabolic panel  . Magnesium  . EKG 12-Lead   No orders of the defined types were placed in this encounter.   Patient Instructions  Medication Instructions:  Your physician recommends that you continue on your current medications as directed. Please refer to the Current Medication list given to you today.  *If you need a refill on your cardiac medications before your next appointment, please call your pharmacy*  Lab Work: Basic metabolic panel and Magnesium  - Today   If you have labs (blood work) drawn today and your tests are completely normal, you will receive your results only by: Marland Kitchen MyChart Message (if you have MyChart) OR . A paper copy in the mail If you have any lab test that is abnormal or we need to change your treatment, we will call you to review the results.   Testing/Procedures: Your physician has ordered for you to have a coronary CTA on 06/20/2019 @ 4:45 PM *Instructions below*  Follow-Up: At [facility], you and your health needs are our priority.  As part of our continuing mission to provide  you with exceptional heart care, we have created designated Provider Care Teams.  These Care Teams include your primary Cardiologist (physician) and Advanced Practice Providers (APPs -  Physician Assistants and Nurse Practitioners) who all work together to provide you with the care you need, when you need it.  We recommend signing up for the patient portal called "MyChart".  Sign up information is provided on this After Visit Summary.  MyChart is used to connect with patients for Virtual Visits (Telemedicine).  Patients are able to view lab/test results, encounter notes, upcoming appointments, etc.  Non-urgent messages can be sent to your provider as well.   To learn more about what you can do with MyChart, go to [email or web address].    Your next appointment:   3 month(s)  The format for your next appointment:   In Person  Provider:   Norman Herrlich, MD    Other Instructions  [facility] [address] [place], [place] [address] [phone]  If scheduled at [facility], please arrive at the [facility] main entrance of [facility] 30 minutes prior to test start time. Proceed to the [facility] Department (first floor) to check-in and test prep.   Please follow these instructions carefully (unless otherwise directed):   On the Night Before the Test: . Be sure to Drink plenty of water. . Do not consume any caffeinated/decaffeinated beverages or chocolate 12 hours prior to your test. . Do not take any antihistamines 12 hours prior to your test.  On the Day of the Test: . Drink plenty of water. Do not drink any water within one hour of the test. . Do not eat any food 4 hours prior to the test. . You may take your regular medications prior to the test . Take 6.25 mg of your coreg 1 hour prior to your test  . FEMALES- please wear underwire-free bra if available     After the Test: . Drink plenty of water. . After receiving IV contrast,  you may experience a mild flushed feeling. This is normal. . On occasion, you may experience a mild rash up to 24 hours after the test. This is not dangerous. If this occurs, you can take Benadr 25 mg and increase your fluid intake. . If you experience trouble breathing, this can be serious. If it is severe call 911 IMMEDIATELY. If it is mild, please call our of  For non-scheduling related questions, please contact the cardiac imaging nurse navigator should you have any questions/concerns: Rockwell Alexandria, RN Navigator Cardiac Imaging [facility] [phone] office  For scheduling needs, including cancellations and rescheduling, please call [phone].       Adopting a Healthy Lifestyle.  Know what a healthy weight is for you (roughly BMI <25) and aim to maintain this   Aim for 7+ servings of fruits and vegetables daily   65-80+ fluid ounces of water or unsweet tea for healthy kidneys   Limit to max 1 drink of alcohol per day; avoid smoking/tobacco   Limit animal fats in diet for cholesterol and heart health - choose grass fed whenever available   Avoid highly processed foods, and foods high in saturated/trans fats   Aim for low stress - take time to unwind and care for your mental health   Aim for 150 min of moderate intensity exercise weekly for heart health, and weights twice weekly for bone health   Aim for 7-9 hours of sleep daily   When it comes to diets, agreement about the perfect plan isnt easy to find, even among the experts. Experts at the [facility] [employer] developed an idea known as the Healthy Eating Plate. Just imagine a plate divided into logical, healthy portions.   The emphasis is on diet quality:   Load up on vegetables and fruits - one-half of your plate: Aim for color and variety, and  remember that potatoes dont count.   Go for whole grains - one-quarter of your plate: Whole wheat, barley, wheat berries, quinoa,  oats, brown rice, and foods made with them. If you want pasta, go with whole wheat pasta.   Protein power - one-quarter of your plate: Fish, chicken, beans, and nuts are all healthy, versatile protein sources. Limit red meat.   The diet, however, does go beyond the plate, offering a few other suggestions.   Use healthy plant oils, such as olive, canola, soy, corn, sunflower and peanut. Check the labels, and avoid partially hydrogenated oil, which have unhealthy trans fats.   If youre thirsty, drink water. Coffee and tea are good in moderation, but skip sugary drinks and limit milk and dairy products to one or two daily servings.   The type of carbohydrate in the diet is more important than the amount. Some sources of carbohydrates, such as vegetables, fruits, whole grains, and beans-are healthier than others.   Finally, stay active  Signed, Thomasene Ripple, DO  06/19/2019 1:47 PM    [facility]

## 2019-06-19 NOTE — Patient Instructions (Addendum)
Medication Instructions:  Your physician recommends that you continue on your current medications as directed. Please refer to the Current Medication list given to you today.  *If you need a refill on your cardiac medications before your next appointment, please call your pharmacy*  Lab Work: Basic metabolic panel and Magnesium  - Today   If you have labs (blood work) drawn today and your tests are completely normal, you will receive your results only by: Marland Kitchen MyChart Message (if you have MyChart) OR . A paper copy in the mail If you have any lab test that is abnormal or we need to change your treatment, we will call you to review the results.   Testing/Procedures: Your physician has ordered for you to have a coronary CTA on 06/20/2019 @ 4:45 PM *Instructions below*  Follow-Up: At [facility], you and your health needs are our priority.  As part of our continuing mission to provide you with exceptional heart care, we have created designated Provider Care Teams.  These Care Teams include your primary Cardiologist (physician) and Advanced Practice Providers (APPs -  Physician Assistants and Nurse Practitioners) who all work together to provide you with the care you need, when you need it.  We recommend signing up for the patient portal called "MyChart".  Sign up information is provided on this After Visit Summary.  MyChart is used to connect with patients for Virtual Visits (Telemedicine).  Patients are able to view lab/test results, encounter notes, upcoming appointments, etc.  Non-urgent messages can be sent to your provider as well.   To learn more about what you can do with MyChart, go to [email or web address].    Your next appointment:   3 month(s)  The format for your next appointment:   In Person  Provider:   Norman Herrlich, MD    Other Instructions  [facility] [address] [place], [place] [address] [phone]  If scheduled at [facility],  please arrive at the [facility] main entrance of [facility] 30 minutes prior to test start time. Proceed to the [facility] Radiology Department (first floor) to check-in and test prep.   Please follow these instructions carefully (unless otherwise directed):   On the Night Before the Test: . Be sure to Drink plenty of water. . Do not consume any caffeinated/decaffeinated beverages or chocolate 12 hours prior to your test. . Do not take any antihistamines 12 hours prior to your test.  On the Day of the Test: . Drink plenty of water. Do not drink any water within one hour of the test. . Do not eat any food 4 hours prior to the test. . You may take your regular medications prior to the test . Take 6.25 mg of your coreg 1 hour prior to your test  . FEMALES- please wear underwire-free bra if available     After the Test: . Drink plenty of water. . After receiving IV contrast, you may experience a mild flushed feeling. This is normal. . On occasion, you may experience a mild rash up to 24 hours after the test. This is not dangerous. If this occurs, you can take Benadr 25 mg and increase your fluid intake. . If you experience trouble breathing, this can be serious. If it is severe call 911 IMMEDIATELY. If it is mild, please call our of  For non-scheduling related questions, please contact the cardiac imaging nurse navigator should you have any questions/concerns: Rockwell Alexandria, RN Navigator Cardiac Imaging Patrcia Dolly  [facility] [phone] office  For scheduling needs, including cancellations and rescheduling, please call [phone].

## 2019-06-19 NOTE — Telephone Encounter (Signed)
Attempted to call patient regarding upcoming cardiac CT appointment. °Left message on voicemail with name and callback number °Schwanda Zima RN Navigator Cardiac Imaging °[facility] °[phone] Office °[phone] Cell ° °

## 2019-06-20 ENCOUNTER — Ambulatory Visit (HOSPITAL_COMMUNITY)
Admission: RE | Admit: 2019-06-20 | Discharge: 2019-06-20 | Disposition: A | Discharge status: Home / Self Care | Payer: Medicare Other | Source: Ambulatory Visit | Attending: Cardiology | Admitting: Cardiology

## 2019-06-20 ENCOUNTER — Encounter (HOSPITAL_COMMUNITY): Payer: Self-pay

## 2019-06-20 DIAGNOSIS — I2510 Atherosclerotic heart disease of native coronary artery without angina pectoris: Secondary | ICD-10-CM | POA: Diagnosis not present

## 2019-06-20 DIAGNOSIS — I700 Atherosclerosis of aorta: Secondary | ICD-10-CM | POA: Diagnosis not present

## 2019-06-20 DIAGNOSIS — R0602 Shortness of breath: Secondary | ICD-10-CM | POA: Insufficient documentation

## 2019-06-20 DIAGNOSIS — E785 Hyperlipidemia, unspecified: Secondary | ICD-10-CM | POA: Insufficient documentation

## 2019-06-20 DIAGNOSIS — Z6832 Body mass index (BMI) 32.0-32.9, adult: Secondary | ICD-10-CM | POA: Diagnosis not present

## 2019-06-20 DIAGNOSIS — E669 Obesity, unspecified: Secondary | ICD-10-CM | POA: Diagnosis not present

## 2019-06-20 DIAGNOSIS — I10 Essential (primary) hypertension: Secondary | ICD-10-CM | POA: Insufficient documentation

## 2019-06-20 IMAGING — CT CT HEART MORP W/ CTA COR W/ SCORE W/ CA W/CM &/OR W/O CM
4 of 7 series · 8 of 20 positions shown, 9 images · non-contrast
Comparison: 04/03/2019
COMPARISON: 04/03/2019

Addendum:
EXAM:
OVER-READ INTERPRETATION  CT CHEST

The following report is an over-read performed by radiologist Dr.
Shanz Joshim [REDACTED] on 06/20/2019. This over-read
does not include interpretation of cardiac or coronary anatomy or
pathology. The coronary CTA interpretation by the cardiologist is
attached.
CLINICAL DATA: 68 year old female preoperative for Abdominal Aorta
Aneurysm. Medical history includes Hypertension, Hyperlipidemia and
Obesity.
Cardiac/Coronary  CT
TECHNIQUE: The patient was scanned on a Phillips Force scanner.

[Series 6: best diast 75 % · axial · 0.39mm/px · z∈[+1178,+1229]mm · 2 of 378 slices shown, 3 images]
[im 126/378  vessel]
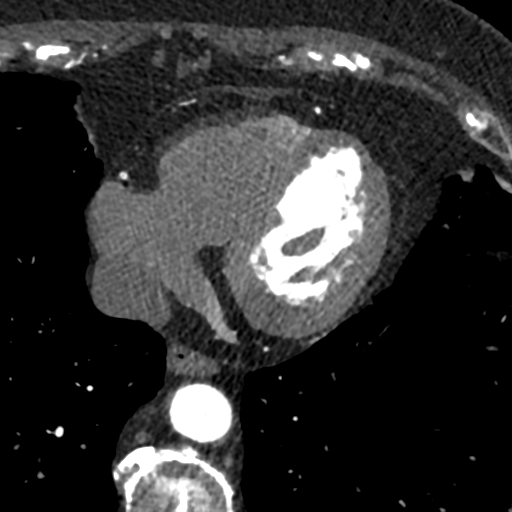
[im 126/378  lung]
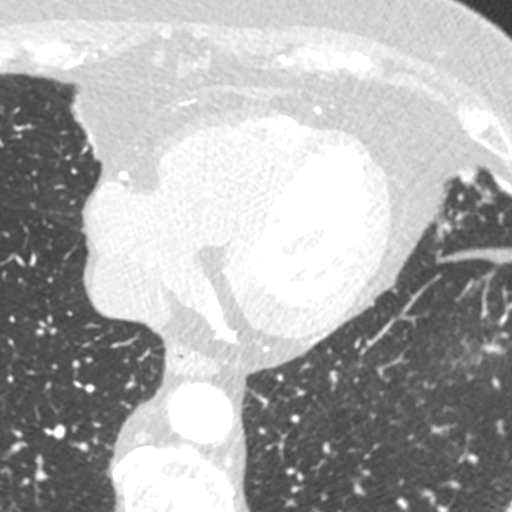
[im 252/378  vessel]
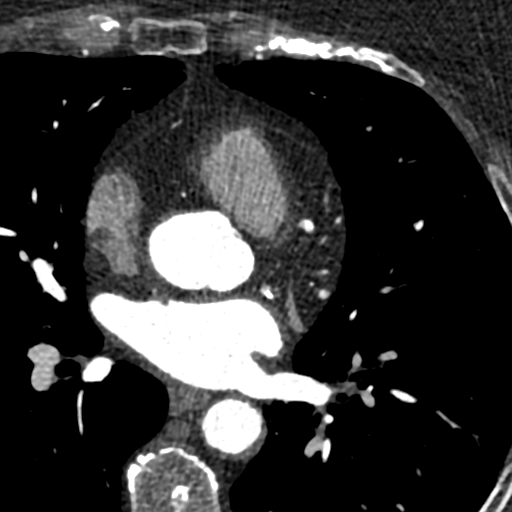

[Series 7: best syst 39 % · axial · 0.39mm/px · z∈[+1178,+1229]mm · 2 of 378 slices shown]
[im 126/378  vessel]
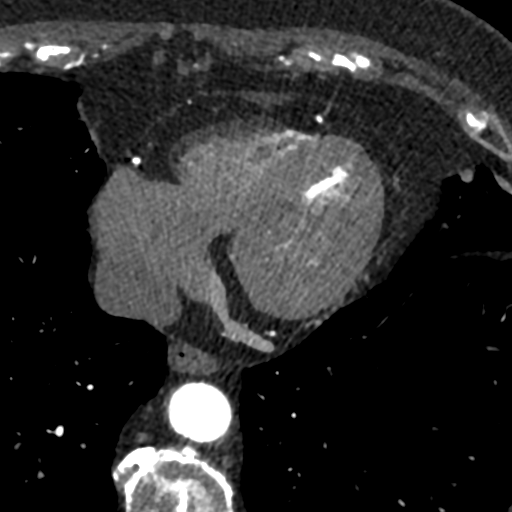
[im 252/378  vessel]
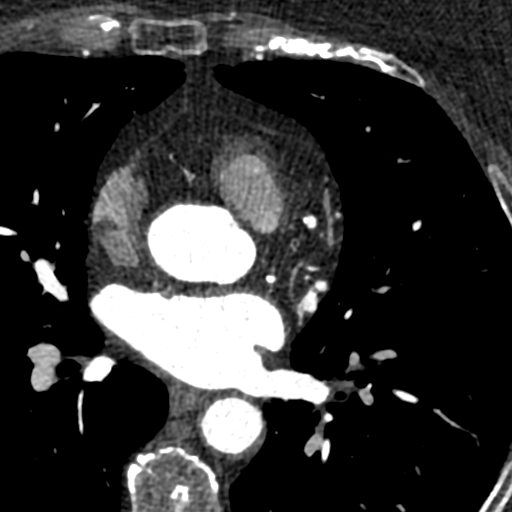

[Series 8: ts diast sharp 39 % · axial · 0.39mm/px · z∈[+1178,+1229]mm · 2 of 378 slices shown]
[im 126/378  lung]
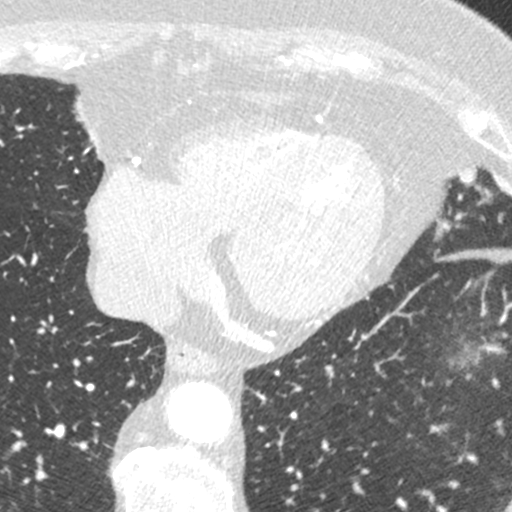
[im 252/378  lung]
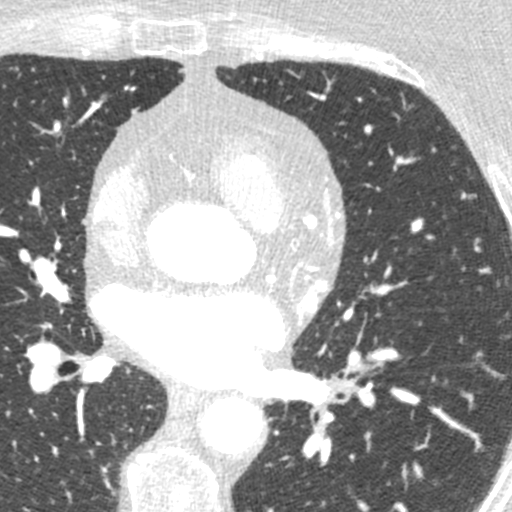

[Series 9: ts syst sharp 39 % · axial · 0.39mm/px · z∈[+1178,+1229]mm · 2 of 378 slices shown]
[im 126/378  lung]
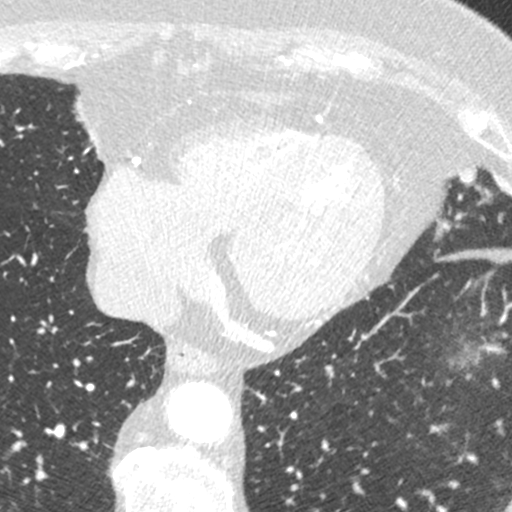
[im 252/378  lung]
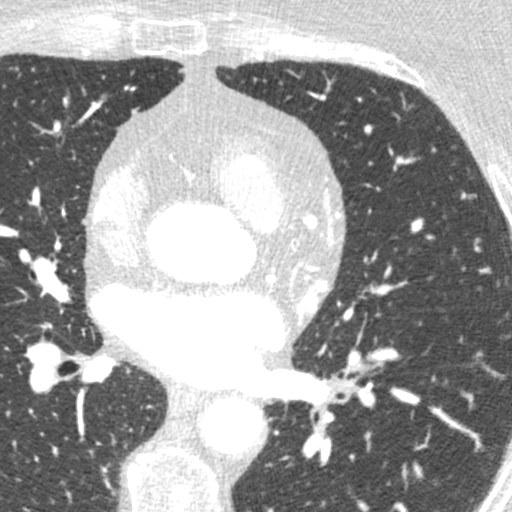

[8 of 20 positions shown; findings below may reference images not displayed]

FINDINGS: Vascular: Heart is normal size. Visualized aorta normal caliber.
Calcifications in the descending thoracic aorta.

Mediastinum/Nodes: No adenopathy in the lower mediastinum or hila.

Lungs/Pleura: No confluent airspace opacities or effusions.

Upper Abdomen: Imaging into the upper abdomen shows no acute
findings.

Musculoskeletal: Chest wall soft tissues are unremarkable. No acute
bony abnormality.
IMPRESSION: No acute extra cardiac abnormality.

Descending aortic atherosclerosis.
FINDINGS: A 120 kV prospective scan was triggered in the descending thoracic
aorta at 111 HU's. Axial non-contrast 3 mm slices were carried out
through the heart. The data set was analyzed on a dedicated work
station and scored using the Agatson method. Gantry rotation speed
was 250 msecs and collimation was .6 mm. No beta blockade and 0.8 mg
of sl NTG was given. The 3D data set was reconstructed in 5%
intervals of the 67-82 % of the R-R cycle. Diastolic phases were
analyzed on a dedicated work station using MPR, MIP and VRT modes.
The patient received 80 cc of contrast.

Aorta: Normal size. Aortic root calcifications with noted
calcification in the descending aorta. No dissection.

Aortic Valve:  Trileaflet.  No calcifications.

Coronary Arteries:  Normal coronary origin.  Right dominance.

RCA is a large dominant artery that gives rise to PDA and PLVB.
There is a mild (25-49%) calcified plaque in the proximal portion of
the vessel. Mid segmental moderate (50-69%) calcified plaques. The
distal portion before the bifurcation with severe (>70%) calcified
plaque. There is a moderate (50-69%) calcified plaque at the ostium
of the PDA.

Left main is a large artery that gives rise to LAD and LCX arteries.
The left main is severely (70-99%) calcified throughout the vessel.

LAD is a large vessel. The proximal segment with severe (70-99%)
dense tubular calcification. Mid to distal moderate (50-69%)
calcified plaques. D1 has a high take off with minimal
calcifications.

LCX is a non-dominant artery that gives rise to one large OM1
branch. The proximal portion of the LCX with moderate (50-69%)
calcified plaque. There is a severe (>70%) calcified plaque in the
mid portion of the LCX. Minimal (<24%) diffuse calcification in the
distal LCX.

Other findings:

Normal pulmonary vein drainage into the left atrium.

Normal left atrial appendage without a thrombus.

Normal size of the pulmonary artery.
IMPRESSION: 1. Coronary calcium score of 9782. This was 99 percentile for age
and sex matched control.

2. Normal coronary origin with right dominance.

3. Severe Multivessel Coronary Artery Disease. CADRADS 4B (Left Main
>50% with 3 vessel Severe CAD). Recommend further diagnostic testing
with Cardiac Catheterization.

4. Aortic root calcification with noted calcification in the
descending aorta.

Rosalvo Sasaki, DO

*** End of Addendum ***
EXAM:
OVER-READ INTERPRETATION  CT CHEST

The following report is an over-read performed by radiologist Dr.
Shanz Joshim [REDACTED] on 06/20/2019. This over-read
does not include interpretation of cardiac or coronary anatomy or
pathology. The coronary CTA interpretation by the cardiologist is
attached.
FINDINGS: Vascular: Heart is normal size. Visualized aorta normal caliber.
Calcifications in the descending thoracic aorta.

Mediastinum/Nodes: No adenopathy in the lower mediastinum or hila.

Lungs/Pleura: No confluent airspace opacities or effusions.

Upper Abdomen: Imaging into the upper abdomen shows no acute
findings.

Musculoskeletal: Chest wall soft tissues are unremarkable. No acute
bony abnormality.
IMPRESSION: No acute extra cardiac abnormality.

Descending aortic atherosclerosis.

## 2019-06-20 MED ORDER — NITROGLYCERIN 0.4 MG SL SUBL: 0.8000 mg | SUBLINGUAL_TABLET | Freq: Once | SUBLINGUAL | Status: AC

## 2019-06-20 MED ORDER — METOPROLOL TARTRATE 5 MG/5ML IV SOLN
INTRAVENOUS | Status: AC
  Administered 2019-06-20: 5 mg via INTRAVENOUS
  Filled 2019-06-20: qty 10

## 2019-06-20 MED ORDER — IOHEXOL 350 MG/ML SOLN
100.0000 mL | Freq: Once | INTRAVENOUS | Status: AC | PRN
  Administered 2019-06-20: 100 mL via INTRAVENOUS

## 2019-06-20 MED ORDER — NITROGLYCERIN 0.4 MG SL SUBL
SUBLINGUAL_TABLET | SUBLINGUAL | Status: AC
  Administered 2019-06-20: 17:00:00 0.8 mg via SUBLINGUAL
  Filled 2019-06-20: qty 2

## 2019-06-20 MED ORDER — METOPROLOL TARTRATE 5 MG/5ML IV SOLN: 5.0000 mg | Freq: Once | INTRAVENOUS | Status: AC

## 2019-06-21 ENCOUNTER — Telehealth: Payer: Self-pay | Admitting: Cardiology

## 2019-06-21 NOTE — Telephone Encounter (Signed)
Multiple attempts to call patient to discuss her coronary CT results.  No answer and no voicemail.

## 2019-06-22 ENCOUNTER — Other Ambulatory Visit: Payer: Self-pay

## 2019-06-22 ENCOUNTER — Ambulatory Visit (INDEPENDENT_AMBULATORY_CARE_PROVIDER_SITE_OTHER): Payer: Medicare Other | Admitting: Cardiology

## 2019-06-22 ENCOUNTER — Encounter: Payer: Self-pay | Admitting: *Deleted

## 2019-06-22 ENCOUNTER — Encounter: Payer: Self-pay | Admitting: Cardiology

## 2019-06-22 VITALS — BP 130/66 | HR 79 | Ht 66.0 in | Wt 202.0 lb

## 2019-06-22 DIAGNOSIS — E782 Mixed hyperlipidemia: Secondary | ICD-10-CM

## 2019-06-22 DIAGNOSIS — I714 Abdominal aortic aneurysm, without rupture, unspecified: Secondary | ICD-10-CM

## 2019-06-22 DIAGNOSIS — I10 Essential (primary) hypertension: Secondary | ICD-10-CM

## 2019-06-22 DIAGNOSIS — I2510 Atherosclerotic heart disease of native coronary artery without angina pectoris: Secondary | ICD-10-CM | POA: Diagnosis not present

## 2019-06-22 MED ORDER — ROSUVASTATIN CALCIUM 40 MG PO TABS: 40.0000 mg | ORAL_TABLET | Freq: Every day | ORAL | 3 refills | Status: AC

## 2019-06-22 NOTE — Progress Notes (Signed)
Cardiology Office Note:    Date:  06/22/2019   ID:  Cathy Haney, DOB 02-Feb-1951, MRN [id number]  PCP:  Maris Berger, MD  Cardiologist:  Norman Herrlich, MD  Electrophysiologist:  None   Referring MD: Maris Berger, MD   Chief Complaint  Patient presents with  . Follow-up    History of Present Illness:    Cathy Haney is a 69 y.o. female with a hx of hypertension, hyperlipidemia, prediabetes presented recently to be evaluated for preoperative clearance.  I did send the patient for coronary CTA she is here today to discuss these results.   Past Medical History:  Diagnosis Date  . Allergy   . Aortic aneurysm of unspecified site, without rupture (HCC) 05/30/2017  . Benign essential hypertension 05/30/2017  . Diverticulitis   . Hypothyroidism    Aquired  . Mixed hyperlipidemia   . Prediabetes   . Thyroid disease     Past Surgical History:  Procedure Laterality Date  . BACK SURGERY     khyphoplasty  . CARPAL TUNNEL RELEASE    . COLONOSCOPY    . EXCISION OF SKIN LESION      Current Medications: Current Meds  Medication Sig  . albuterol (VENTOLIN HFA) 108 (90 Base) MCG/ACT inhaler Inhale into the lungs as needed for wheezing or shortness of breath.   Marland Kitchen alendronate (FOSAMAX) 70 MG tablet Take 70 mg by mouth once a week. Take with a full glass of water on an empty stomach.  . Ascorbic Acid (VITAMIN C WITH ROSE HIPS) 250 MG tablet Take 500 mg by mouth daily.  Marland Kitchen aspirin EC 81 MG tablet Take 81 mg by mouth daily.  . carvedilol (COREG) 6.25 MG tablet Take 6.25 mg by mouth 2 (two) times daily with a meal.   . Cholecalciferol (VITAMIN D-3 PO) Take by mouth.  . gabapentin (NEURONTIN) 300 MG capsule Take 300 mg by mouth 5 (five) times daily.   Marland Kitchen glucose blood test strip Accu-Chek Aviva Plus test strips  Use to check BS daily.  Marland Kitchen levothyroxine (SYNTHROID, LEVOTHROID) 125 MCG tablet Take 125 mcg by mouth daily before breakfast.  . lisinopril (PRINIVIL,ZESTRIL) 10 MG tablet Take  10 mg by mouth daily.  . Multiple Vitamin (MULTIVITAMIN) capsule Take 1 capsule by mouth daily.  . Multiple Vitamins-Minerals (ICAPS AREDS 2 PO) Take by mouth.  . Omega-3 Fatty Acids (FISH OIL) 1000 MG CAPS Take 2,000 mg by mouth daily.   . [DISCONTINUED] pravastatin (PRAVACHOL) 40 MG tablet Take 40 mg by mouth daily.     Allergies:   Patient has no known allergies.   Social History   Socioeconomic History  . Marital status: Widowed    Spouse name: Not on file  . Number of children: Not on file  . Years of education: Not on file  . Highest education level: Not on file  Occupational History  . Not on file  Tobacco Use  . Smoking status: Former Smoker    Types: Cigarettes  . Smokeless tobacco: Never Used  . Tobacco comment: 6 cigs a day  Substance and Sexual Activity  . Alcohol use: No  . Drug use: No  . Sexual activity: Never  Other Topics Concern  . Not on file  Social History Narrative  . Not on file   Social Determinants of Health   Financial Resource Strain:   . Difficulty of Paying Living Expenses:   Food Insecurity:   . Worried About Programme researcher, broadcasting/film/video in the Last  Year:   . Ran Out of Food in the Last Year:   Transportation Needs:   . Lack of Transportation (Medical):   . Lack of Transportation (Non-Medical):   Physical Activity:   . Days of Exercise per Week:   . Minutes of Exercise per Session:   Stress:   . Feeling of Stress :   Social Connections:   . Frequency of Communication with Friends and Family:   . Frequency of Social Gatherings with Friends and Family:   . Attends Religious Services:   . Active Member of Clubs or Organizations:   . Attends Club or Organization Meetings:   . Marital Status:      Family History: The patient's family history includes Colon cancer in her sister; Diabetes in her brother; Heart disease in her brother and mother.  ROS:   Review of Systems  Constitution: Negative for decreased appetite, fever and weight gain.    HENT: Negative for congestion, ear discharge, hoarse voice and sore throat.   Eyes: Negative for discharge, redness, vision loss in right eye and visual halos.  Cardiovascular: Negative for chest pain, dyspnea on exertion, leg swelling, orthopnea and palpitations.  Respiratory: Negative for cough, hemoptysis, shortness of breath and snoring.   Endocrine: Negative for heat intolerance and polyphagia.  Hematologic/Lymphatic: Negative for bleeding problem. Does not bruise/bleed easily.  Skin: Negative for flushing, nail changes, rash and suspicious lesions.  Musculoskeletal: Negative for arthritis, joint pain, muscle cramps, myalgias, neck pain and stiffness.  Gastrointestinal: Negative for abdominal pain, bowel incontinence, diarrhea and excessive appetite.  Genitourinary: Negative for decreased libido, genital sores and incomplete emptying.  Neurological: Negative for brief paralysis, focal weakness, headaches and loss of balance.  Psychiatric/Behavioral: Negative for altered mental status, depression and suicidal ideas.  Allergic/Immunologic: Negative for HIV exposure and persistent infections.    EKGs/Labs/Other Studies Reviewed:    The following studies were reviewed today:   EKG:  None today  Coronary CTA IMPRESSION: 06/20/2019 1. Coronary calcium score of 2430. This was 99 percentile for age and sex matched control.  2. Normal coronary origin with right dominance.  3. Severe Multivessel Coronary Artery Disease. CADRADS 4B (Left Main >50% with 3 vessel Severe CAD). Recommend further diagnostic testing with Cardiac Catheterization.  4. Aortic root calcification with noted calcification in the descending aorta.  Recent Labs: No results found for requested labs within last 8760 hours.  Recent Lipid Panel No results found for: CHOL, TRIG, HDL, CHOLHDL, VLDL, LDLCALC, LDLDIRECT  Physical Exam:    VS:  BP 130/66 (BP Location: Left Arm, Patient Position: Sitting, Cuff Size:  Normal)   Pulse 79   Ht 5' 6" (1.676 m)   Wt 202 lb (91.6 kg)   SpO2 95%   BMI 32.60 kg/m     Wt Readings from Last 3 Encounters:  06/22/19 202 lb (91.6 kg)  06/19/19 201 lb (91.2 kg)  06/15/19 200 lb (90.7 kg)     GEN: Well nourished, well developed in no acute distress HEENT: Normal NECK: No JVD; No carotid bruits LYMPHATICS: No lymphadenopathy CARDIAC: S1S2 noted,RRR, no murmurs, rubs, gallops RESPIRATORY:  Clear to auscultation without rales, wheezing or rhonchi  ABDOMEN: Soft, non-tender, non-distended, +bowel sounds, no guarding. EXTREMITIES: No edema, No cyanosis, no clubbing MUSCULOSKELETAL:  No deformity  SKIN: Warm and dry NEUROLOGIC:  Alert and oriented x 3, non-focal PSYCHIATRIC:  Normal affect, good insight  ASSESSMENT:    1. Coronary artery disease involving native coronary artery of native heart without angina   pectoris   2. Benign essential hypertension   3. Mixed hyperlipidemia   4. Abdominal aortic aneurysm (AAA) without rupture (HCC)    PLAN:    I discussed with the patient with her sister present about the results of her CCTA. Calcium score is 2430 with significant severe multivessel CAD. At this time we will proceed with left heart catheterization. I have educated the patient about this test.  The patient understands that risks include but are not limited to stroke (1 in 1000), death (1 in 1000), kidney failure [usually temporary] (1 in 500), bleeding (1 in 200), allergic reaction [possibly serious] (1 in 200), and agrees to proceed.  I will start her on Aspirin 81 mg daily. Her Pravastatin stop for a more potent crestor 40 mg daily.   I did update her vascular surgeon about the plan and he was agreeable.   The patient is in agreement with the above plan. The patient left the office in stable condition.  The patient will follow up as scheduled with Dr. Munley   Medication Adjustments/Labs and Tests Ordered: Current medicines are reviewed at length  with the patient today.  Concerns regarding medicines are outlined above.  Orders Placed This Encounter  Procedures  . Basic metabolic panel  . CBC   Meds ordered this encounter  Medications  . rosuvastatin (CRESTOR) 40 MG tablet    Sig: Take 1 tablet (40 mg total) by mouth daily.    Dispense:  90 tablet    Refill:  3    Patient Instructions  Your physician has recommended you make the following change in your medication:  1. DISCONTINUE PRAVASTATIN  2. START CRESTOR 40 MG DAILY  3. START ASPIRIN 81 MG DAILY  Lab Work: Your physician recommends that you return for lab work in: bmet and cbc today  If you have labs (blood work) drawn today and your tests are completely normal, you will receive your results only by: . MyChart Message (if you have MyChart) OR . A paper copy in the mail If you have any lab test that is abnormal or we need to change your treatment, we will call you to review the results.   Testing/Procedures: Your physician has requested that you have a cardiac catheterization. Cardiac catheterization is used to diagnose and/or treat various heart conditions. Doctors may recommend this procedure for a number of different reasons. The most common reason is to evaluate chest pain. Chest pain can be a symptom of coronary artery disease (CAD), and cardiac catheterization can show whether plaque is narrowing or blocking your heart's arteries. This procedure is also used to evaluate the valves, as well as measure the blood flow and oxygen levels in different parts of your heart. For further information please visit www.cardiosmart.org. Please follow instruction sheet, as given.  Your Pre-procedure COVID-19 Testing will be done on 06/25/2019  at [address], [place], After your swab you will be given a mask to wear and instructed to go home and quarantine you are not allowed to have visitors until after your procedure. If you test positive you will be notified and  your procedure will be cancelled.        Follow-Up: At [facility], you and your health needs are our priority.  As part of our continuing mission to provide you with exceptional heart care, we have created designated Provider Care Teams.  These Care Teams include your primary Cardiologist (physician) and Advanced Practice Providers (APPs -  Physician Assistants   and Nurse Practitioners) who all work together to provide you with the care you need, when you need it.  We recommend signing up for the patient portal called "MyChart".  Sign up information is provided on this After Visit Summary.  MyChart is used to connect with patients for Virtual Visits (Telemedicine).  Patients are able to view lab/test results, encounter notes, upcoming appointments, etc.  Non-urgent messages can be sent to your provider as well.   To learn more about what you can do with MyChart, go to [email or web address].    Your next appointment:   2 week(s)  The format for your next appointment:   In Person  Provider:   Taiven Greenley, DO   Other Instructions.inst    [facility] [place] [address] [place] [address] Dept: [phone] Loc: [phone]  Cathy Haney  06/22/2019  You are scheduled for a Cardiac Catheterization on Wednesday, March 31 with Dr. Henry Smith.  1. Please arrive at the [facility] Tower (Main Entrance A) at [facility]: [address] , [place] [address] at 8:00 AM (This time is two hours before your procedure to ensure your preparation). Free valet parking service is available.   Special note: Every effort is made to have your procedure done on time. Please understand that emergencies sometimes delay scheduled procedures.  2. Diet: Do not eat solid foods after midnight.  The patient may have clear liquids until 5am upon the day of the procedure.  3. Medication instructions in preparation for  your procedure:     On the morning of your procedure, take your Aspirin and any morning medicines NOT listed above.  You may use sips of water.  5. Plan for one night stay--bring personal belongings. 6. Bring a current list of your medications and current insurance cards. 7. You MUST have a responsible person to drive you home. 8. Someone MUST be with you the first 24 hours after you arrive home or your discharge will be delayed. 9. Please wear clothes that are easy to get on and off and wear slip-on shoes.  Thank you for allowing us to care for you!   -- [facility]       Adopting a Healthy Lifestyle.  Know what a healthy weight is for you (roughly BMI <25) and aim to maintain this   Aim for 7+ servings of fruits and vegetables daily   65-80+ fluid ounces of water or unsweet tea for healthy kidneys   Limit to max 1 drink of alcohol per day; avoid smoking/tobacco   Limit animal fats in diet for cholesterol and heart health - choose grass fed whenever available   Avoid highly processed foods, and foods high in saturated/trans fats   Aim for low stress - take time to unwind and care for your mental health   Aim for 150 min of moderate intensity exercise weekly for heart health, and weights twice weekly for bone health   Aim for 7-9 hours of sleep daily   When it comes to diets, agreement about the perfect plan isnt easy to find, even among the experts. Experts at the [employer] developed an idea known as the Healthy Eating Plate. Just imagine a plate divided into logical, healthy portions.   The emphasis is on diet quality:   Load up on vegetables and fruits - one-half of your plate: Aim for color and variety, and remember   that potatoes dont count.   Go for whole grains - one-quarter of your plate: Whole wheat, barley, wheat berries, quinoa, oats, brown rice, and foods made with them. If you want pasta, go with whole  wheat pasta.   Protein power - one-quarter of your plate: Fish, chicken, beans, and nuts are all healthy, versatile protein sources. Limit red meat.   The diet, however, does go beyond the plate, offering a few other suggestions.   Use healthy plant oils, such as olive, canola, soy, corn, sunflower and peanut. Check the labels, and avoid partially hydrogenated oil, which have unhealthy trans fats.   If youre thirsty, drink water. Coffee and tea are good in moderation, but skip sugary drinks and limit milk and dairy products to one or two daily servings.   The type of carbohydrate in the diet is more important than the amount. Some sources of carbohydrates, such as vegetables, fruits, whole grains, and beans-are healthier than others.   Finally, stay active  Signed, Deena Shaub, DO  06/22/2019 9:57 AM    [facility] 

## 2019-06-22 NOTE — H&P (View-Only) (Signed)
Cardiology Office Note:    Date:  06/22/2019   ID:  Cathy Haney, DOB 02-Feb-1951, MRN [id number]  PCP:  Maris Berger, MD  Cardiologist:  Norman Herrlich, MD  Electrophysiologist:  None   Referring MD: Maris Berger, MD   Chief Complaint  Patient presents with  . Follow-up    History of Present Illness:    Cathy Haney is a 69 y.o. female with a hx of hypertension, hyperlipidemia, prediabetes presented recently to be evaluated for preoperative clearance.  I did send the patient for coronary CTA she is here today to discuss these results.   Past Medical History:  Diagnosis Date  . Allergy   . Aortic aneurysm of unspecified site, without rupture (HCC) 05/30/2017  . Benign essential hypertension 05/30/2017  . Diverticulitis   . Hypothyroidism    Aquired  . Mixed hyperlipidemia   . Prediabetes   . Thyroid disease     Past Surgical History:  Procedure Laterality Date  . BACK SURGERY     khyphoplasty  . CARPAL TUNNEL RELEASE    . COLONOSCOPY    . EXCISION OF SKIN LESION      Current Medications: Current Meds  Medication Sig  . albuterol (VENTOLIN HFA) 108 (90 Base) MCG/ACT inhaler Inhale into the lungs as needed for wheezing or shortness of breath.   Marland Kitchen alendronate (FOSAMAX) 70 MG tablet Take 70 mg by mouth once a week. Take with a full glass of water on an empty stomach.  . Ascorbic Acid (VITAMIN C WITH ROSE HIPS) 250 MG tablet Take 500 mg by mouth daily.  Marland Kitchen aspirin EC 81 MG tablet Take 81 mg by mouth daily.  . carvedilol (COREG) 6.25 MG tablet Take 6.25 mg by mouth 2 (two) times daily with a meal.   . Cholecalciferol (VITAMIN D-3 PO) Take by mouth.  . gabapentin (NEURONTIN) 300 MG capsule Take 300 mg by mouth 5 (five) times daily.   Marland Kitchen glucose blood test strip Accu-Chek Aviva Plus test strips  Use to check BS daily.  Marland Kitchen levothyroxine (SYNTHROID, LEVOTHROID) 125 MCG tablet Take 125 mcg by mouth daily before breakfast.  . lisinopril (PRINIVIL,ZESTRIL) 10 MG tablet Take  10 mg by mouth daily.  . Multiple Vitamin (MULTIVITAMIN) capsule Take 1 capsule by mouth daily.  . Multiple Vitamins-Minerals (ICAPS AREDS 2 PO) Take by mouth.  . Omega-3 Fatty Acids (FISH OIL) 1000 MG CAPS Take 2,000 mg by mouth daily.   . [DISCONTINUED] pravastatin (PRAVACHOL) 40 MG tablet Take 40 mg by mouth daily.     Allergies:   Patient has no known allergies.   Social History   Socioeconomic History  . Marital status: Widowed    Spouse name: Not on file  . Number of children: Not on file  . Years of education: Not on file  . Highest education level: Not on file  Occupational History  . Not on file  Tobacco Use  . Smoking status: Former Smoker    Types: Cigarettes  . Smokeless tobacco: Never Used  . Tobacco comment: 6 cigs a day  Substance and Sexual Activity  . Alcohol use: No  . Drug use: No  . Sexual activity: Never  Other Topics Concern  . Not on file  Social History Narrative  . Not on file   Social Determinants of Health   Financial Resource Strain:   . Difficulty of Paying Living Expenses:   Food Insecurity:   . Worried About Programme researcher, broadcasting/film/video in the Last  Year:   . Ran Out of Food in the Last Year:   Transportation Needs:   . Film/video editor (Medical):   Marland Kitchen Lack of Transportation (Non-Medical):   Physical Activity:   . Days of Exercise per Week:   . Minutes of Exercise per Session:   Stress:   . Feeling of Stress :   Social Connections:   . Frequency of Communication with Friends and Family:   . Frequency of Social Gatherings with Friends and Family:   . Attends Religious Services:   . Active Member of Clubs or Organizations:   . Attends Archivist Meetings:   Marland Kitchen Marital Status:      Family History: The patient's family history includes Colon cancer in her sister; Diabetes in her brother; Heart disease in her brother and mother.  ROS:   Review of Systems  Constitution: Negative for decreased appetite, fever and weight gain.    HENT: Negative for congestion, ear discharge, hoarse voice and sore throat.   Eyes: Negative for discharge, redness, vision loss in right eye and visual halos.  Cardiovascular: Negative for chest pain, dyspnea on exertion, leg swelling, orthopnea and palpitations.  Respiratory: Negative for cough, hemoptysis, shortness of breath and snoring.   Endocrine: Negative for heat intolerance and polyphagia.  Hematologic/Lymphatic: Negative for bleeding problem. Does not bruise/bleed easily.  Skin: Negative for flushing, nail changes, rash and suspicious lesions.  Musculoskeletal: Negative for arthritis, joint pain, muscle cramps, myalgias, neck pain and stiffness.  Gastrointestinal: Negative for abdominal pain, bowel incontinence, diarrhea and excessive appetite.  Genitourinary: Negative for decreased libido, genital sores and incomplete emptying.  Neurological: Negative for brief paralysis, focal weakness, headaches and loss of balance.  Psychiatric/Behavioral: Negative for altered mental status, depression and suicidal ideas.  Allergic/Immunologic: Negative for HIV exposure and persistent infections.    EKGs/Labs/Other Studies Reviewed:    The following studies were reviewed today:   EKG:  None today  Coronary CTA IMPRESSION: 06/20/2019 1. Coronary calcium score of 2430. This was 27 percentile for age and sex matched control.  2. Normal coronary origin with right dominance.  3. Severe Multivessel Coronary Artery Disease. CADRADS 4B (Left Main >50% with 3 vessel Severe CAD). Recommend further diagnostic testing with Cardiac Catheterization.  4. Aortic root calcification with noted calcification in the descending aorta.  Recent Labs: No results found for requested labs within last 8760 hours.  Recent Lipid Panel No results found for: CHOL, TRIG, HDL, CHOLHDL, VLDL, LDLCALC, LDLDIRECT  Physical Exam:    VS:  BP 130/66 (BP Location: Left Arm, Patient Position: Sitting, Cuff Size:  Normal)   Pulse 79   Ht 5\' 6"  (1.676 m)   Wt 202 lb (91.6 kg)   SpO2 95%   BMI 32.60 kg/m     Wt Readings from Last 3 Encounters:  06/22/19 202 lb (91.6 kg)  06/19/19 201 lb (91.2 kg)  06/15/19 200 lb (90.7 kg)     GEN: Well nourished, well developed in no acute distress HEENT: Normal NECK: No JVD; No carotid bruits LYMPHATICS: No lymphadenopathy CARDIAC: S1S2 noted,RRR, no murmurs, rubs, gallops RESPIRATORY:  Clear to auscultation without rales, wheezing or rhonchi  ABDOMEN: Soft, non-tender, non-distended, +bowel sounds, no guarding. EXTREMITIES: No edema, No cyanosis, no clubbing MUSCULOSKELETAL:  No deformity  SKIN: Warm and dry NEUROLOGIC:  Alert and oriented x 3, non-focal PSYCHIATRIC:  Normal affect, good insight  ASSESSMENT:    1. Coronary artery disease involving native coronary artery of native heart without angina  pectoris   2. Benign essential hypertension   3. Mixed hyperlipidemia   4. Abdominal aortic aneurysm (AAA) without rupture (HCC)    PLAN:    I discussed with the patient with her sister present about the results of her CCTA. Calcium score is 2430 with significant severe multivessel CAD. At this time we will proceed with left heart catheterization. I have educated the patient about this test.  The patient understands that risks include but are not limited to stroke (1 in 1000), death (1 in 1000), kidney failure [usually temporary] (1 in 500), bleeding (1 in 200), allergic reaction [possibly serious] (1 in 200), and agrees to proceed.  I will start her on Aspirin 81 mg daily. Her Pravastatin stop for a more potent crestor 40 mg daily.   I did update her vascular surgeon about the plan and he was agreeable.   The patient is in agreement with the above plan. The patient left the office in stable condition.  The patient will follow up as scheduled with Dr. Dulce Sellar   Medication Adjustments/Labs and Tests Ordered: Current medicines are reviewed at length  with the patient today.  Concerns regarding medicines are outlined above.  Orders Placed This Encounter  Procedures  . Basic metabolic panel  . CBC   Meds ordered this encounter  Medications  . rosuvastatin (CRESTOR) 40 MG tablet    Sig: Take 1 tablet (40 mg total) by mouth daily.    Dispense:  90 tablet    Refill:  3    Patient Instructions  Your physician has recommended you make the following change in your medication:  1. DISCONTINUE PRAVASTATIN  2. START CRESTOR 40 MG DAILY  3. START ASPIRIN 81 MG DAILY  Lab Work: Your physician recommends that you return for lab work in: bmet and cbc today  If you have labs (blood work) drawn today and your tests are completely normal, you will receive your results only by: Marland Kitchen MyChart Message (if you have MyChart) OR . A paper copy in the mail If you have any lab test that is abnormal or we need to change your treatment, we will call you to review the results.   Testing/Procedures: Your physician has requested that you have a cardiac catheterization. Cardiac catheterization is used to diagnose and/or treat various heart conditions. Doctors may recommend this procedure for a number of different reasons. The most common reason is to evaluate chest pain. Chest pain can be a symptom of coronary artery disease (CAD), and cardiac catheterization can show whether plaque is narrowing or blocking your heart's arteries. This procedure is also used to evaluate the valves, as well as measure the blood flow and oxygen levels in different parts of your heart. For further information please visit [email or web address] Please follow instruction sheet, as given.  Your Pre-procedure COVID-19 Testing will be done on 06/25/2019  at [address] [facility], [place], After your swab you will be given a mask to wear and instructed to go home and quarantine you are not allowed to have visitors until after your procedure. If you test positive you will be notified and  your procedure will be cancelled.        Follow-Up: At [facility], you and your health needs are our priority.  As part of our continuing mission to provide you with exceptional heart care, we have created designated Provider Care Teams.  These Care Teams include your primary Cardiologist (physician) and Advanced Practice Providers (APPs -  Physician Assistants  and Nurse Practitioners) who all work together to provide you with the care you need, when you need it.  We recommend signing up for the patient portal called "MyChart".  Sign up information is provided on this After Visit Summary.  MyChart is used to connect with patients for Virtual Visits (Telemedicine).  Patients are able to view lab/test results, encounter notes, upcoming appointments, etc.  Non-urgent messages can be sent to your provider as well.   To learn more about what you can do with MyChart, go to [email or web address].    Your next appointment:   2 week(s)  The format for your next appointment:   In Person  Provider:   Thomasene Ripple, DO   Other Instructions.inst    [facility] CARDIOVASCULAR DIVISION [facility] HEARTCARE AT [place] [address] [place] [address] Dept: [phone] Loc: [phone]  Cathy Haney  06/22/2019  You are scheduled for a Cardiac Catheterization on Wednesday, March 31 with Dr. Verdis Prime.  1. Please arrive at the [facility] (Main Entrance A) at [facility]: [address] [place], [place] [address] at 8:00 AM (This time is two hours before your procedure to ensure your preparation). Free valet parking service is available.   Special note: Every effort is made to have your procedure done on time. Please understand that emergencies sometimes delay scheduled procedures.  2. Diet: Do not eat solid foods after midnight.  The patient may have clear liquids until 5am upon the day of the procedure.  3. Medication instructions in preparation for  your procedure:     On the morning of your procedure, take your Aspirin and any morning medicines NOT listed above.  You may use sips of water.  5. Plan for one night stay--bring personal belongings. 6. Bring a current list of your medications and current insurance cards. 7. You MUST have a responsible person to drive you home. 8. Someone MUST be with you the first 24 hours after you arrive home or your discharge will be delayed. 9. Please wear clothes that are easy to get on and off and wear slip-on shoes.  Thank you for allowing [place] to care for you!   -- [facility]       Adopting a Healthy Lifestyle.  Know what a healthy weight is for you (roughly BMI <25) and aim to maintain this   Aim for 7+ servings of fruits and vegetables daily   65-80+ fluid ounces of water or unsweet tea for healthy kidneys   Limit to max 1 drink of alcohol per day; avoid smoking/tobacco   Limit animal fats in diet for cholesterol and heart health - choose grass fed whenever available   Avoid highly processed foods, and foods high in saturated/trans fats   Aim for low stress - take time to unwind and care for your mental health   Aim for 150 min of moderate intensity exercise weekly for heart health, and weights twice weekly for bone health   Aim for 7-9 hours of sleep daily   When it comes to diets, agreement about the perfect plan isnt easy to find, even among the experts. Experts at the [facility] [employer] developed an idea known as the Healthy Eating Plate. Just imagine a plate divided into logical, healthy portions.   The emphasis is on diet quality:   Load up on vegetables and fruits - one-half of your plate: Aim for color and variety, and remember  that potatoes dont count.   Go for whole grains - one-quarter of your plate: Whole wheat, barley, wheat berries, quinoa, oats, brown rice, and foods made with them. If you want pasta, go with whole  wheat pasta.   Protein power - one-quarter of your plate: Fish, chicken, beans, and nuts are all healthy, versatile protein sources. Limit red meat.   The diet, however, does go beyond the plate, offering a few other suggestions.   Use healthy plant oils, such as olive, canola, soy, corn, sunflower and peanut. Check the labels, and avoid partially hydrogenated oil, which have unhealthy trans fats.   If youre thirsty, drink water. Coffee and tea are good in moderation, but skip sugary drinks and limit milk and dairy products to one or two daily servings.   The type of carbohydrate in the diet is more important than the amount. Some sources of carbohydrates, such as vegetables, fruits, whole grains, and beans-are healthier than others.   Finally, stay active  Signed, Thomasene RippleKardie Dare Sanger, DO  06/22/2019 9:57 AM    [facility]

## 2019-06-22 NOTE — Patient Instructions (Addendum)
Your physician has recommended you make the following change in your medication:  1. DISCONTINUE PRAVASTATIN  2. START CRESTOR 40 MG DAILY  3. START ASPIRIN 81 MG DAILY  Lab Work: Your physician recommends that you return for lab work in: bmet and cbc today  If you have labs (blood work) drawn today and your tests are completely normal, you will receive your results only by: Marland Kitchen MyChart Message (if you have MyChart) OR . A paper copy in the mail If you have any lab test that is abnormal or we need to change your treatment, we will call you to review the results.   Testing/Procedures: Your physician has requested that you have a cardiac catheterization. Cardiac catheterization is used to diagnose and/or treat various heart conditions. Doctors may recommend this procedure for a number of different reasons. The most common reason is to evaluate chest pain. Chest pain can be a symptom of coronary artery disease (CAD), and cardiac catheterization can show whether plaque is narrowing or blocking your heart's arteries. This procedure is also used to evaluate the valves, as well as measure the blood flow and oxygen levels in different parts of your heart. For further information please visit [email or web address]. Please follow instruction sheet, as given.  Your Pre-procedure COVID-19 Testing will be done on 06/25/2019  at [place], After your swab you will be given a mask to wear and instructed to go home and quarantine you are not allowed to have visitors until after your procedure. If you test positive you will be notified and your procedure will be cancelled.        Follow-Up: At [facility], you and your health needs are our priority.  As part of our continuing mission to provide you with exceptional heart care, we have created designated Provider Care Teams.  These Care Teams include your primary Cardiologist (physician) and Advanced Practice Providers (APPs -   Physician Assistants and Nurse Practitioners) who all work together to provide you with the care you need, when you need it.  We recommend signing up for the patient portal called "MyChart".  Sign up information is provided on this After Visit Summary.  MyChart is used to connect with patients for Virtual Visits (Telemedicine).  Patients are able to view lab/test results, encounter notes, upcoming appointments, etc.  Non-urgent messages can be sent to your provider as well.   To learn more about what you can do with MyChart, go to [email or web address].    Your next appointment:   2 week(s)  The format for your next appointment:   In Person  Provider:   Berniece Salines, DO   Other Instructions.inst    [place] [address] Dept: [phone] Loc: [place]  06/22/2019  You are scheduled for a Cardiac Catheterization on Wednesday, March 31 with Dr. Daneen Schick.  1. Please arrive at the [facility] (Main Entrance A) at [facility]: [address] [place], [place] [address] at 8:00 AM (This time is two hours before your procedure to ensure your preparation). Free valet parking service is available.   Special note: Every effort is made to have your procedure done on time. Please understand that emergencies sometimes delay scheduled procedures.  2. Diet: Do not eat solid foods after midnight.  The patient may have clear liquids until 5am upon the day of the procedure.  3. Medication instructions  in preparation for your procedure:     On the morning of your procedure, take your Aspirin and any morning medicines NOT listed above.  You may use sips of water.  5. Plan for one night stay--bring personal belongings. 6. Bring a current list of your medications and current insurance cards. 7. You MUST have a responsible person to drive you home. 8. Someone  MUST be with you the first 24 hours after you arrive home or your discharge will be delayed. 9. Please wear clothes that are easy to get on and off and wear slip-on shoes.  Thank you for allowing [place] to care for you!   -- [place] [facility]

## 2019-06-23 LAB — CBC
Hematocrit: 36.5 % (ref 34.0–46.6)
Hemoglobin: 12.1 g/dL (ref 11.1–15.9)
MCH: 31 pg (ref 26.6–33.0)
MCHC: 33.2 g/dL (ref 31.5–35.7)
MCV: 94 fL (ref 79–97)
Platelets: 227 10*3/uL (ref 150–450)
RBC: 3.9 x10E6/uL (ref 3.77–5.28)
RDW: 13.2 % (ref 11.7–15.4)
WBC: 8.9 10*3/uL (ref 3.4–10.8)

## 2019-06-23 LAB — BASIC METABOLIC PANEL
BUN/Creatinine Ratio: 16 (ref 12–28)
BUN: 13 mg/dL (ref 8–27)
CO2: 20 mmol/L (ref 20–29)
Calcium: 9.6 mg/dL (ref 8.7–10.3)
Chloride: 101 mmol/L (ref 96–106)
Creatinine, Ser: 0.82 mg/dL (ref 0.57–1.00)
GFR calc Af Amer: 84 mL/min/{1.73_m2} (ref >59)
GFR calc non Af Amer: 73 mL/min/{1.73_m2} (ref >59)
Glucose: 139 mg/dL — ABNORMAL HIGH (ref 65–99)
Potassium: 4.2 mmol/L (ref 3.5–5.2)
Sodium: 137 mmol/L (ref 134–144)

## 2019-06-25 ENCOUNTER — Other Ambulatory Visit (HOSPITAL_COMMUNITY)
Admission: RE | Admit: 2019-06-25 | Discharge: 2019-06-25 | Disposition: A | Discharge status: Home / Self Care | Payer: Medicare Other | Source: Ambulatory Visit | Attending: Interventional Cardiology | Admitting: Interventional Cardiology

## 2019-06-25 DIAGNOSIS — Z20822 Contact with and (suspected) exposure to covid-19: Secondary | ICD-10-CM | POA: Diagnosis not present

## 2019-06-25 DIAGNOSIS — Z01812 Encounter for preprocedural laboratory examination: Secondary | ICD-10-CM | POA: Diagnosis present

## 2019-06-25 LAB — SARS CORONAVIRUS 2 (TAT 6-24 HRS): SARS Coronavirus 2: NEGATIVE

## 2019-06-26 ENCOUNTER — Telehealth: Payer: Self-pay | Admitting: *Deleted

## 2019-06-26 NOTE — H&P (Signed)
Preop evaluation for AAA repair--> Cor CTA--> LM and severe 3V CAD. Diagnostic cath to confirm.

## 2019-06-26 NOTE — Telephone Encounter (Signed)
Pt contacted pre-catheterization scheduled at [facility] for: Wednesday June 27, 2019 10 AM Verified arrival time and place: [facility] Main Entrance A [facility]) at: 8 AM   No solid food after midnight prior to cath, clear liquids until 5 AM day of procedure. Contrast allergy: no   AM meds can be  taken pre-cath with sip of water including: ASA 81 mg   Confirmed patient has responsible adult to drive home post procedure and observe 24 hours after arriving home:   Currently, due to Covid-19 pandemic, only one person will be allowed with patient. Must be the same person for patient's entire stay and will be required to wear a mask. They will be asked to wait in the waiting room for the duration of the patient's stay.  Patients are required to wear a mask when they enter the hospital.  06/26/19 I attempted to contact patient to review procedure instructions, received voicemail message mailbox not set up, unable to leave a message.

## 2019-06-26 NOTE — Telephone Encounter (Signed)
    COVID-19 Pre-Screening Questions:  . In the past 7 to 10 days have you had a cough,  shortness of breath, headache, congestion, fever (100 or greater) body aches, chills, sore throat, or sudden loss of taste or sense of smell? no . Have you been around anyone with known Covid 19 in the past 7-10 days? no . Have you been around anyone who is awaiting Covid 19 test results in the past 7 to 10 days? no . Have you been around anyone who has been exposed to Covid 19, or has mentioned symptoms of Covid 19 within the past 7 to 10 days? No  I reviewed procedure/mask/visitor instructions, COVID-19 screening questions with patient.              

## 2019-06-26 NOTE — Telephone Encounter (Signed)
  Pt returning call, transferred call 

## 2019-06-27 ENCOUNTER — Other Ambulatory Visit: Payer: Self-pay

## 2019-06-27 ENCOUNTER — Ambulatory Visit (HOSPITAL_COMMUNITY)
Admission: RE | Admit: 2019-06-27 | Discharge: 2019-06-27 | Disposition: A | Discharge status: Home / Self Care | Payer: Medicare Other | Source: Ambulatory Visit | Attending: Interventional Cardiology | Admitting: Interventional Cardiology

## 2019-06-27 ENCOUNTER — Encounter (HOSPITAL_COMMUNITY)
Admission: RE | Disposition: A | Discharge status: Home / Self Care | Payer: Medicare Other | Source: Ambulatory Visit | Attending: Interventional Cardiology

## 2019-06-27 DIAGNOSIS — Z79899 Other long term (current) drug therapy: Secondary | ICD-10-CM | POA: Diagnosis not present

## 2019-06-27 DIAGNOSIS — I2510 Atherosclerotic heart disease of native coronary artery without angina pectoris: Secondary | ICD-10-CM

## 2019-06-27 DIAGNOSIS — I2541 Coronary artery aneurysm: Secondary | ICD-10-CM | POA: Diagnosis not present

## 2019-06-27 DIAGNOSIS — R7303 Prediabetes: Secondary | ICD-10-CM | POA: Diagnosis present

## 2019-06-27 DIAGNOSIS — Z7982 Long term (current) use of aspirin: Secondary | ICD-10-CM | POA: Insufficient documentation

## 2019-06-27 DIAGNOSIS — Z8249 Family history of ischemic heart disease and other diseases of the circulatory system: Secondary | ICD-10-CM | POA: Insufficient documentation

## 2019-06-27 DIAGNOSIS — E039 Hypothyroidism, unspecified: Secondary | ICD-10-CM | POA: Insufficient documentation

## 2019-06-27 DIAGNOSIS — Z87891 Personal history of nicotine dependence: Secondary | ICD-10-CM | POA: Insufficient documentation

## 2019-06-27 DIAGNOSIS — Z01810 Encounter for preprocedural cardiovascular examination: Secondary | ICD-10-CM

## 2019-06-27 DIAGNOSIS — I714 Abdominal aortic aneurysm, without rupture: Secondary | ICD-10-CM | POA: Insufficient documentation

## 2019-06-27 DIAGNOSIS — Z79890 Hormone replacement therapy (postmenopausal): Secondary | ICD-10-CM | POA: Diagnosis not present

## 2019-06-27 DIAGNOSIS — I10 Essential (primary) hypertension: Secondary | ICD-10-CM | POA: Diagnosis not present

## 2019-06-27 DIAGNOSIS — E782 Mixed hyperlipidemia: Secondary | ICD-10-CM | POA: Insufficient documentation

## 2019-06-27 DIAGNOSIS — I719 Aortic aneurysm of unspecified site, without rupture: Secondary | ICD-10-CM | POA: Diagnosis present

## 2019-06-27 HISTORY — DX: Atherosclerotic heart disease of native coronary artery without angina pectoris: I25.10

## 2019-06-27 HISTORY — PX: LEFT HEART CATH AND CORONARY ANGIOGRAPHY: CATH118249

## 2019-06-27 SURGERY — LEFT HEART CATH AND CORONARY ANGIOGRAPHY
Anesthesia: LOCAL

## 2019-06-27 MED ORDER — ACETAMINOPHEN 325 MG PO TABS: 650.0000 mg | ORAL_TABLET | ORAL | Status: DC | PRN

## 2019-06-27 MED ORDER — OXYCODONE HCL 5 MG PO TABS: 5.0000 mg | ORAL_TABLET | ORAL | Status: DC | PRN

## 2019-06-27 MED ORDER — FENTANYL CITRATE (PF) 100 MCG/2ML IJ SOLN
INTRAMUSCULAR | Status: DC | PRN
  Administered 2019-06-27: 25 ug via INTRAVENOUS

## 2019-06-27 MED ORDER — HYDRALAZINE HCL 20 MG/ML IJ SOLN: 10.0000 mg | INTRAMUSCULAR | Status: DC | PRN

## 2019-06-27 MED ORDER — LIDOCAINE HCL (PF) 1 % IJ SOLN
INTRAMUSCULAR | Status: AC
  Filled 2019-06-27: qty 30

## 2019-06-27 MED ORDER — SODIUM CHLORIDE 0.9 % WEIGHT BASED INFUSION
3.0000 mL/kg/h | INTRAVENOUS | Status: AC
  Administered 2019-06-27: 3 mL/kg/h via INTRAVENOUS

## 2019-06-27 MED ORDER — MIDAZOLAM HCL 2 MG/2ML IJ SOLN
INTRAMUSCULAR | Status: AC
  Filled 2019-06-27: qty 2

## 2019-06-27 MED ORDER — FENTANYL CITRATE (PF) 100 MCG/2ML IJ SOLN
INTRAMUSCULAR | Status: AC
  Filled 2019-06-27: qty 2

## 2019-06-27 MED ORDER — SODIUM CHLORIDE 0.9% FLUSH: 3.0000 mL | INTRAVENOUS | Status: DC | PRN

## 2019-06-27 MED ORDER — SODIUM CHLORIDE 0.9% FLUSH: 3.0000 mL | Freq: Two times a day (BID) | INTRAVENOUS | Status: DC

## 2019-06-27 MED ORDER — VERAPAMIL HCL 2.5 MG/ML IV SOLN
INTRAVENOUS | Status: AC
  Filled 2019-06-27: qty 2

## 2019-06-27 MED ORDER — LIDOCAINE HCL (PF) 1 % IJ SOLN
INTRAMUSCULAR | Status: DC | PRN
  Administered 2019-06-27: 3 mL

## 2019-06-27 MED ORDER — SODIUM CHLORIDE 0.9 % IV SOLN: INTRAVENOUS | Status: DC

## 2019-06-27 MED ORDER — IOHEXOL 350 MG/ML SOLN
INTRAVENOUS | Status: DC | PRN
  Administered 2019-06-27: 90 mL

## 2019-06-27 MED ORDER — ASPIRIN 81 MG PO CHEW: 81.0000 mg | CHEWABLE_TABLET | Freq: Every day | ORAL | Status: DC

## 2019-06-27 MED ORDER — HEPARIN SODIUM (PORCINE) 1000 UNIT/ML IJ SOLN
INTRAMUSCULAR | Status: DC | PRN
  Administered 2019-06-27: 4500 [IU] via INTRAVENOUS

## 2019-06-27 MED ORDER — SODIUM CHLORIDE 0.9 % IV SOLN: 250.0000 mL | INTRAVENOUS | Status: DC | PRN

## 2019-06-27 MED ORDER — HEPARIN (PORCINE) IN NACL 1000-0.9 UT/500ML-% IV SOLN
INTRAVENOUS | Status: DC | PRN
  Administered 2019-06-27 (×2): 500 mL

## 2019-06-27 MED ORDER — MIDAZOLAM HCL 2 MG/2ML IJ SOLN
INTRAMUSCULAR | Status: DC | PRN
  Administered 2019-06-27 (×2): 1 mg via INTRAVENOUS

## 2019-06-27 MED ORDER — LABETALOL HCL 5 MG/ML IV SOLN: 10.0000 mg | INTRAVENOUS | Status: DC | PRN

## 2019-06-27 MED ORDER — SODIUM CHLORIDE 0.9 % WEIGHT BASED INFUSION: 1.0000 mL/kg/h | INTRAVENOUS | Status: DC

## 2019-06-27 MED ORDER — HEPARIN (PORCINE) IN NACL 1000-0.9 UT/500ML-% IV SOLN
INTRAVENOUS | Status: AC
  Filled 2019-06-27: qty 1000

## 2019-06-27 MED ORDER — HEPARIN SODIUM (PORCINE) 1000 UNIT/ML IJ SOLN
INTRAMUSCULAR | Status: AC
  Filled 2019-06-27: qty 1

## 2019-06-27 MED ORDER — ASPIRIN 81 MG PO CHEW: 81.0000 mg | CHEWABLE_TABLET | ORAL | Status: DC

## 2019-06-27 MED ORDER — ONDANSETRON HCL 4 MG/2ML IJ SOLN: 4.0000 mg | Freq: Four times a day (QID) | INTRAMUSCULAR | Status: DC | PRN

## 2019-06-27 MED ORDER — VERAPAMIL HCL 2.5 MG/ML IV SOLN
INTRAVENOUS | Status: DC | PRN
  Administered 2019-06-27: 10 mL via INTRA_ARTERIAL

## 2019-06-27 SURGICAL SUPPLY — 10 items
CATH 5FR JL3.5 JR4 ANG PIG MP (CATHETERS) ×1 IMPLANT
DEVICE RAD COMP TR BAND LRG (VASCULAR PRODUCTS) ×1 IMPLANT
GLIDESHEATH SLEND A-KIT 6F 22G (SHEATH) ×1 IMPLANT
GUIDEWIRE INQWIRE 1.5J.035X260 (WIRE) IMPLANT
INQWIRE 1.5J .035X260CM (WIRE) ×2
KIT HEART LEFT (KITS) ×2 IMPLANT
PACK CARDIAC CATHETERIZATION (CUSTOM PROCEDURE TRAY) ×2 IMPLANT
SHEATH PROBE COVER 6X72 (BAG) ×1 IMPLANT
TRANSDUCER W/STOPCOCK (MISCELLANEOUS) ×2 IMPLANT
TUBING CIL FLEX 10 FLL-RA (TUBING) ×2 IMPLANT

## 2019-06-27 NOTE — Interval H&P Note (Signed)
Cath Lab Visit (complete for each Cath Lab visit)  Clinical Evaluation Leading to the Procedure:   ACS: No.  Non-ACS:    Anginal Classification: No Symptoms  Anti-ischemic medical therapy: No Therapy  Non-Invasive Test Results: High-risk stress test findings: cardiac mortality >3%/year  Prior CABG: No previous CABG      History and Physical Interval Note:  06/27/2019 9:43 AM  Cathy Haney  has presented today for surgery, with the diagnosis of CAD.  The various methods of treatment have been discussed with the patient and family. After consideration of risks, benefits and other options for treatment, the patient has consented to  Procedure(s): LEFT HEART CATH AND CORONARY ANGIOGRAPHY (N/A) as a surgical intervention.  The patient's history has been reviewed, patient examined, no change in status, stable for surgery.  I have reviewed the patient's chart and labs.  Questions were answered to the patient's satisfaction.     Lyn Records III

## 2019-06-27 NOTE — Progress Notes (Signed)
Discharge instructions reviewed with patient and sister. Verbalized understanding. 

## 2019-06-27 NOTE — CV Procedure (Signed)
   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  Normal LV function.  Normal LVEDP.  Left heart cath, coronary angiography, left ventriculography using right radial access obtained by real-time vascular ultrasound guidance.  Okay to proceed with abdominal aortic aneurysm percutaneous procedure.  Needs aggressive risk factor modification to prevent progression of vascular disease.  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

## 2019-06-27 NOTE — Discharge Instructions (Signed)
Radial Site Care  This sheet gives you information about how to care for yourself after your procedure. Your health care provider may also give you more specific instructions. If you have problems or questions, contact your health care provider. What can I expect after the procedure? After the procedure, it is common to have:  Bruising and tenderness at the catheter insertion area. Follow these instructions at home: Medicines  Take over-the-counter and prescription medicines only as told by your health care provider. Insertion site care  Follow instructions from your health care provider about how to take care of your insertion site. Make sure you: ? Wash your hands with soap and water before you change your bandage (dressing). If soap and water are not available, use hand sanitizer. ? Change your dressing as told by your health care provider. ? Leave stitches (sutures), skin glue, or adhesive strips in place. These skin closures may need to stay in place for 2 weeks or longer. If adhesive strip edges start to loosen and curl up, you may trim the loose edges. Do not remove adhesive strips completely unless your health care provider tells you to do that.  Check your insertion site every day for signs of infection. Check for: ? Redness, swelling, or pain. ? Fluid or blood. ? Pus or a bad smell. ? Warmth.  Do not take baths, swim, or use a hot tub until your health care provider approves.  You may shower 24-48 hours after the procedure, or as directed by your health care provider. ? Remove the dressing and gently wash the site with plain soap and water. ? Pat the area dry with a clean towel. ? Do not rub the site. That could cause bleeding.  Do not apply powder or lotion to the site. Activity   For 24 hours after the procedure, or as directed by your health care provider: ? Do not flex or bend the affected arm. ? Do not push or pull heavy objects with the affected arm. ? Do not  drive yourself home from the hospital or clinic. You may drive 24 hours after the procedure unless your health care provider tells you not to. ? Do not operate machinery or power tools.  Do not lift anything that is heavier than 10 lb (4.5 kg), or the limit that you are told, until your health care provider says that it is safe.  Ask your health care provider when it is okay to: ? Return to work or school. ? Resume usual physical activities or sports. ? Resume sexual activity. General instructions  If the catheter site starts to bleed, raise your arm and put firm pressure on the site. If the bleeding does not stop, get help right away. This is a medical emergency.  If you went home on the same day as your procedure, a responsible adult should be with you for the first 24 hours after you arrive home.  Keep all follow-up visits as told by your health care provider. This is important. Contact a health care provider if:  You have a fever.  You have redness, swelling, or yellow drainage around your insertion site. Get help right away if:  You have unusual pain at the radial site.  The catheter insertion area swells very fast.  The insertion area is bleeding, and the bleeding does not stop when you hold steady pressure on the area.  Your arm or hand becomes pale, cool, tingly, or numb. These symptoms may represent a serious problem   that is an emergency. Do not wait to see if the symptoms will go away. Get medical help right away. Call your local emergency services (911 in the [place] Do not drive yourself to the hospital. Summary  After the procedure, it is common to have bruising and tenderness at the site.  Follow instructions from your health care provider about how to take care of your radial site wound. Check the wound every day for signs of infection.  Do not lift anything that is heavier than 10 lb (4.5 kg), or the limit that you are told, until your health care provider says  that it is safe. This information is not intended to replace advice given to you by your health care provider. Make sure you discuss any questions you have with your health care provider. Document Revised: 04/20/2017 Document Reviewed: 04/20/2017 Elsevier Patient Education  2020 [employer]vier Inc.  

## 2019-07-03 ENCOUNTER — Encounter (HOSPITAL_COMMUNITY): Payer: Self-pay

## 2019-07-03 ENCOUNTER — Encounter (HOSPITAL_COMMUNITY)
Admission: RE | Admit: 2019-07-03 | Discharge: 2019-07-03 | Disposition: A | Discharge status: Home / Self Care | Payer: Medicare Other | Source: Ambulatory Visit | Attending: Vascular Surgery | Admitting: Vascular Surgery

## 2019-07-03 ENCOUNTER — Other Ambulatory Visit: Payer: Self-pay

## 2019-07-03 ENCOUNTER — Other Ambulatory Visit (HOSPITAL_COMMUNITY)
Admission: RE | Admit: 2019-07-03 | Discharge: 2019-07-03 | Disposition: A | Discharge status: Home / Self Care | Payer: Medicare Other | Source: Ambulatory Visit | Attending: Vascular Surgery | Admitting: Vascular Surgery

## 2019-07-03 HISTORY — DX: Unspecified osteoarthritis, unspecified site: M19.90

## 2019-07-03 HISTORY — DX: Prediabetes: R73.03

## 2019-07-03 HISTORY — DX: Dyspnea, unspecified: R06.00

## 2019-07-03 HISTORY — DX: Anxiety disorder, unspecified: F41.9

## 2019-07-03 HISTORY — DX: Depression, unspecified: F32.A

## 2019-07-03 HISTORY — DX: Pneumonia, unspecified organism: J18.9

## 2019-07-03 HISTORY — DX: Anemia, unspecified: D64.9

## 2019-07-03 LAB — URINALYSIS, ROUTINE W REFLEX MICROSCOPIC
Bilirubin Urine: NEGATIVE
Glucose, UA: NEGATIVE mg/dL
Hgb urine dipstick: NEGATIVE
Ketones, ur: NEGATIVE mg/dL
Nitrite: NEGATIVE
Protein, ur: NEGATIVE mg/dL
Specific Gravity, Urine: 1.009 (ref 1.005–1.030)
pH: 6 (ref 5.0–8.0)

## 2019-07-03 LAB — HEMOGLOBIN A1C
Hgb A1c MFr Bld: 6.9 % — ABNORMAL HIGH (ref 4.8–5.6)
Mean Plasma Glucose: 151.33 mg/dL

## 2019-07-03 LAB — SURGICAL PCR SCREEN
MRSA, PCR: NEGATIVE
Staphylococcus aureus: NEGATIVE

## 2019-07-03 LAB — CBC
HCT: 37.9 % (ref 36.0–46.0)
Hemoglobin: 12.3 g/dL (ref 12.0–15.0)
MCH: 30.8 pg (ref 26.0–34.0)
MCHC: 32.5 g/dL (ref 30.0–36.0)
MCV: 94.8 fL (ref 80.0–100.0)
Platelets: 244 10*3/uL (ref 150–400)
RBC: 4 MIL/uL (ref 3.87–5.11)
RDW: 13.2 % (ref 11.5–15.5)
WBC: 8.8 10*3/uL (ref 4.0–10.5)
nRBC: 0 % (ref 0.0–0.2)

## 2019-07-03 LAB — COMPREHENSIVE METABOLIC PANEL
ALT: 19 U/L (ref 0–44)
AST: 23 U/L (ref 15–41)
Albumin: 4 g/dL (ref 3.5–5.0)
Alkaline Phosphatase: 51 U/L (ref 38–126)
Anion gap: 10 (ref 5–15)
BUN: 14 mg/dL (ref 8–23)
CO2: 21 mmol/L — ABNORMAL LOW (ref 22–32)
Calcium: 9.2 mg/dL (ref 8.9–10.3)
Chloride: 107 mmol/L (ref 98–111)
Creatinine, Ser: 0.71 mg/dL (ref 0.44–1.00)
GFR calc Af Amer: >60 mL/min (ref >60)
GFR calc non Af Amer: >60 mL/min (ref >60)
Glucose, Bld: 110 mg/dL — ABNORMAL HIGH (ref 70–99)
Potassium: 4.3 mmol/L (ref 3.5–5.1)
Sodium: 138 mmol/L (ref 135–145)
Total Bilirubin: 0.6 mg/dL (ref 0.3–1.2)
Total Protein: 7.4 g/dL (ref 6.5–8.1)

## 2019-07-03 LAB — SARS CORONAVIRUS 2 (TAT 6-24 HRS): SARS Coronavirus 2: NEGATIVE

## 2019-07-03 LAB — TYPE AND SCREEN
ABO/RH(D): A POS
Antibody Screen: NEGATIVE

## 2019-07-03 LAB — APTT: aPTT: 28 seconds (ref 24–36)

## 2019-07-03 LAB — PROTIME-INR
INR: 1 (ref 0.8–1.2)
Prothrombin Time: 12.6 s (ref 11.4–15.2)

## 2019-07-03 LAB — ABO/RH: ABO/RH(D): A POS

## 2019-07-03 LAB — GLUCOSE, CAPILLARY: Glucose-Capillary: 129 mg/dL — ABNORMAL HIGH (ref 70–99)

## 2019-07-03 NOTE — Progress Notes (Signed)
[facility] [address] [place] - [address] [facility] [place] [address] Phone: [phone] Fax: [phone]      Your procedure is scheduled on April 8  Report to [facility] Main Entrance "A" at 0530 A.M., and check in at the Admitting office.  Call this number if you have problems the morning of surgery:  [phone]  Call [phone] if you have any questions prior to your surgery date Monday-Friday 8am-4pm    Remember:  Do not eat or drink after midnight the night before your surgery     Take these medicines the morning of surgery with A SIP OF WATER  albuterol (VENTOLIN HFA) if needed, Please bring all inhalers with you the day of surgery.  Eye drops if needed carvedilol (COREG) docusate sodium (COLACE) if needed fluticasone (FLONASE) if needed gabapentin (NEURONTIN) levothyroxine (SYNTHROID, LEVOTHROID)  Follow your surgeon's instructions on when to stop Aspirin.  If no instructions were given by your surgeon then you will need to call the office to get those instructions.    As of today, STOP taking any Aleve, Naproxen, Ibuprofen, Motrin, Advil, Goody's, BC's, all herbal medications, fish oil, and all vitamins.                     Do not wear jewelry, make up, or nail polish            Do not wear lotions, powders, perfumes/colognes, or deodorant.            Do not shave 48 hours prior to surgery.             Do not bring valuables to the hospital.            [facility] is not responsible for any belongings or valuables.  Do NOT Smoke (Tobacco/Vapping) or drink Alcohol 24 hours prior to your procedure If you use a CPAP at night, you may bring all equipment for your overnight stay.   Contacts, glasses, dentures or bridgework may not be worn into surgery.      For patients admitted to the hospital, discharge time will be determined by your treatment team.   Patients discharged the day of surgery will not be allowed to drive home,  and someone needs to stay with them for 24 hours.    Special instructions:   - Preparing For Surgery  Before surgery, you can play an important role. Because skin is not sterile, your skin needs to be as free of germs as possible. You can reduce the number of germs on your skin by washing with CHG (chlorahexidine gluconate) Soap before surgery.  CHG is an antiseptic cleaner which kills germs and bonds with the skin to continue killing germs even after washing.    Oral Hygiene is also important to reduce your risk of infection.  Remember - BRUSH YOUR TEETH THE MORNING OF SURGERY WITH YOUR REGULAR TOOTHPASTE  Please do not use if you have an allergy to CHG or antibacterial soaps. If your skin becomes reddened/irritated stop using the CHG.  Do not shave (including legs and underarms) for at least 48 hours prior to first CHG shower. It is OK to shave your face.  Please follow these instructions carefully.   1. Shower the NIGHT BEFORE SURGERY and the MORNING OF SURGERY with CHG Soap.   2. If you chose to wash your hair, wash your hair first as usual with your normal shampoo.  3.  After you shampoo, rinse your hair and body thoroughly to remove the shampoo.  4. Use CHG as you would any other liquid soap. You can apply CHG directly to the skin and wash gently with a scrungie or a clean washcloth.   5. Apply the CHG Soap to your body ONLY FROM THE NECK DOWN.  Do not use on open wounds or open sores. Avoid contact with your eyes, ears, mouth and genitals (private parts). Wash Face and genitals (private parts)  with your normal soap.   6. Wash thoroughly, paying special attention to the area where your surgery will be performed.  7. Thoroughly rinse your body with warm water from the neck down.  8. DO NOT shower/wash with your normal soap after using and rinsing off the CHG Soap.  9. Pat yourself dry with a CLEAN TOWEL.  10. Wear CLEAN PAJAMAS to bed the night before surgery, wear  comfortable clothes the morning of surgery  11. Place CLEAN SHEETS on your bed the night of your first shower and DO NOT SLEEP WITH PETS.   Day of Surgery:   Do not apply any deodorants/lotions.  Please wear clean clothes to the hospital/surgery center.   Remember to brush your teeth WITH YOUR REGULAR TOOTHPASTE.   Please read over the following fact sheets that you were given.

## 2019-07-03 NOTE — Progress Notes (Signed)
PCP - [facility] Cardiologist - Norman Herrlich   Chest x-ray - n/a EKG - 06/19/19 Stress Test - 06/13/17 ECHO - denies Cardiac Cath - 06/27/19  Fasting Blood Sugar - under 100 Checks Blood Sugar __1___ times a month to every other month Pre-diabetic  Aspirin Instructions: continue   COVID TEST- 07/03/19 swabbed at PAT   Anesthesia review: yes, cardiac hx  Patient denies shortness of breath, fever, cough and chest pain at PAT appointment   All instructions explained to the patient, with a verbal understanding of the material. Patient agrees to go over the instructions while at home for a better understanding. Patient also instructed to self quarantine after being tested for COVID-19. The opportunity to ask questions was provided.

## 2019-07-04 NOTE — Anesthesia Preprocedure Evaluation (Addendum)
Anesthesia Evaluation  Patient identified by MRN, date of birth, ID band Patient awake    Reviewed: Allergy & Precautions, NPO status , Patient's Chart, lab work & pertinent test results  Airway Mallampati: II  TM Distance: >3 FB Neck ROM: Full    Dental  (+) Missing, Dental Advisory Given,    Pulmonary former smoker,    Pulmonary exam normal        Cardiovascular hypertension, Pt. on home beta blockers and Pt. on medications + CAD   Rhythm:Regular Rate:Normal     Neuro/Psych PSYCHIATRIC DISORDERS Anxiety Depression    GI/Hepatic negative GI ROS, Neg liver ROS,   Endo/Other  Hypothyroidism   Renal/GU negative Renal ROS     Musculoskeletal  (+) Arthritis ,   Abdominal Normal abdominal exam  (+)   Peds  Hematology negative hematology ROS (+)   Anesthesia Other Findings   Reproductive/Obstetrics                           Anesthesia Physical Anesthesia Plan  ASA: III  Anesthesia Plan: General   Post-op Pain Management:    Induction: Intravenous  PONV Risk Score and Plan: 4 or greater and Ondansetron, Dexamethasone, Midazolam and Scopolamine patch - Pre-op  Airway Management Planned: Oral ETT  Additional Equipment: Arterial line  Intra-op Plan:   Post-operative Plan: Extubation in OR  Informed Consent: I have reviewed the patients History and Physical, chart, labs and discussed the procedure including the risks, benefits and alternatives for the proposed anesthesia with the patient or authorized representative who has indicated his/her understanding and acceptance.       Plan Discussed with: CRNA  Anesthesia Plan Comments: (Patient was referred to cardiology for preop risk stratification. Coronary CT revealed very high calcium score with multivessel CAD. Pt subsequently underwent cath showing moderate diffuse three vessel CAD. Per cath report, Dr. Verdis Prime recommended  aggressive risk factor modification and stated pt okay to proceed with abdominal aortic aneurysm percutaneous procedure.   Preop labs reviewed. Diet controlled DMII with A1c 6.9. Remainder of labs unremarkable.  EKG 06/19/19: NSR. Rate 75. Nonspecific ST abnormality.    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 Normal LV function.  Normal LVEDP. Left heart cath, coronary angiography, left ventriculography using right radial access obtained by real-time vascular ultrasound guidance. Okay to proceed with abdominal aortic aneurysm percutaneous procedure. Needs aggressive risk factor modification to prevent progression of vascular disease.  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 )      Anesthesia Quick Evaluation

## 2019-07-04 NOTE — Progress Notes (Signed)
Anesthesia Chart Review:  Patient was referred to cardiology for preop risk stratification. Coronary CT revealed very high calcium score with multivessel CAD. Pt subsequently underwent cath showing moderate diffuse three vessel CAD. Per cath report, Dr. Verdis Prime recommended aggressive risk factor modification and stated pt okay to proceed with abdominal aortic aneurysm percutaneous procedure.   Preop labs reviewed. Diet controlled DMII with A1c 6.9. Remainder of labs unremarkable.  EKG 06/19/19: NSR. Rate 75. Nonspecific ST abnormality.    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  Normal LV function.  Normal LVEDP.  Left heart cath, coronary angiography, left ventriculography using right radial access obtained by real-time vascular ultrasound guidance.  Okay to proceed with abdominal aortic aneurysm percutaneous procedure.  Needs aggressive risk factor modification to prevent progression of vascular disease.  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   Zannie Cove [facility] Short Stay Center/Anesthesiology Phone [phone] 07/04/2019 8:49 AM

## 2019-07-05 ENCOUNTER — Encounter (HOSPITAL_COMMUNITY)
Admission: RE | Disposition: A | Discharge status: Home / Self Care | Payer: Self-pay | Source: Home / Self Care | Attending: Vascular Surgery

## 2019-07-05 ENCOUNTER — Inpatient Hospital Stay (HOSPITAL_COMMUNITY)
Admission: RE | Admit: 2019-07-05 | Discharge: 2019-07-07 | DRG: 272 | Disposition: A | Discharge status: Home / Self Care | Payer: Medicare Other | Attending: Vascular Surgery | Admitting: Vascular Surgery

## 2019-07-05 ENCOUNTER — Inpatient Hospital Stay (HOSPITAL_COMMUNITY): Payer: Medicare Other

## 2019-07-05 ENCOUNTER — Encounter (HOSPITAL_COMMUNITY): Payer: Self-pay | Admitting: Vascular Surgery

## 2019-07-05 ENCOUNTER — Other Ambulatory Visit: Payer: Self-pay

## 2019-07-05 ENCOUNTER — Inpatient Hospital Stay (HOSPITAL_COMMUNITY): Payer: Medicare Other | Admitting: Physician Assistant

## 2019-07-05 DIAGNOSIS — M1990 Unspecified osteoarthritis, unspecified site: Secondary | ICD-10-CM | POA: Diagnosis present

## 2019-07-05 DIAGNOSIS — R7303 Prediabetes: Secondary | ICD-10-CM | POA: Diagnosis present

## 2019-07-05 DIAGNOSIS — I2510 Atherosclerotic heart disease of native coronary artery without angina pectoris: Secondary | ICD-10-CM | POA: Diagnosis present

## 2019-07-05 DIAGNOSIS — I10 Essential (primary) hypertension: Secondary | ICD-10-CM | POA: Diagnosis present

## 2019-07-05 DIAGNOSIS — E039 Hypothyroidism, unspecified: Secondary | ICD-10-CM | POA: Diagnosis present

## 2019-07-05 DIAGNOSIS — I714 Abdominal aortic aneurysm, without rupture, unspecified: Secondary | ICD-10-CM | POA: Diagnosis present

## 2019-07-05 DIAGNOSIS — E669 Obesity, unspecified: Secondary | ICD-10-CM | POA: Diagnosis present

## 2019-07-05 DIAGNOSIS — R110 Nausea: Secondary | ICD-10-CM | POA: Diagnosis not present

## 2019-07-05 DIAGNOSIS — F329 Major depressive disorder, single episode, unspecified: Secondary | ICD-10-CM | POA: Diagnosis present

## 2019-07-05 DIAGNOSIS — E782 Mixed hyperlipidemia: Secondary | ICD-10-CM | POA: Diagnosis present

## 2019-07-05 DIAGNOSIS — Z79899 Other long term (current) drug therapy: Secondary | ICD-10-CM

## 2019-07-05 DIAGNOSIS — Z20822 Contact with and (suspected) exposure to covid-19: Secondary | ICD-10-CM | POA: Diagnosis present

## 2019-07-05 DIAGNOSIS — Z7982 Long term (current) use of aspirin: Secondary | ICD-10-CM

## 2019-07-05 DIAGNOSIS — Z9889 Other specified postprocedural states: Secondary | ICD-10-CM

## 2019-07-05 HISTORY — PX: ABDOMINAL AORTIC ENDOVASCULAR STENT GRAFT: SHX5707

## 2019-07-05 HISTORY — PX: ABDOMINAL AORTA STENT: SHX1108

## 2019-07-05 HISTORY — PX: ENDARTERECTOMY FEMORAL: SHX5804

## 2019-07-05 LAB — BASIC METABOLIC PANEL
Anion gap: 11 (ref 5–15)
BUN: 17 mg/dL (ref 8–23)
CO2: 19 mmol/L — ABNORMAL LOW (ref 22–32)
Calcium: 8.5 mg/dL — ABNORMAL LOW (ref 8.9–10.3)
Chloride: 107 mmol/L (ref 98–111)
Creatinine, Ser: 0.82 mg/dL (ref 0.44–1.00)
GFR calc Af Amer: >60 mL/min (ref >60)
GFR calc non Af Amer: >60 mL/min (ref >60)
Glucose, Bld: 210 mg/dL — ABNORMAL HIGH (ref 70–99)
Potassium: 4.7 mmol/L (ref 3.5–5.1)
Sodium: 137 mmol/L (ref 135–145)

## 2019-07-05 LAB — MAGNESIUM: Magnesium: 1.8 mg/dL (ref 1.7–2.4)

## 2019-07-05 LAB — CBC
HCT: 30.9 % — ABNORMAL LOW (ref 36.0–46.0)
Hemoglobin: 10.2 g/dL — ABNORMAL LOW (ref 12.0–15.0)
MCH: 30.9 pg (ref 26.0–34.0)
MCHC: 33 g/dL (ref 30.0–36.0)
MCV: 93.6 fL (ref 80.0–100.0)
Platelets: 221 10*3/uL (ref 150–400)
RBC: 3.3 MIL/uL — ABNORMAL LOW (ref 3.87–5.11)
RDW: 13.4 % (ref 11.5–15.5)
WBC: 11.9 10*3/uL — ABNORMAL HIGH (ref 4.0–10.5)
nRBC: 0 % (ref 0.0–0.2)

## 2019-07-05 LAB — APTT: aPTT: 41 seconds — ABNORMAL HIGH (ref 24–36)

## 2019-07-05 LAB — GLUCOSE, CAPILLARY
Glucose-Capillary: 142 mg/dL — ABNORMAL HIGH (ref 70–99)
Glucose-Capillary: 174 mg/dL — ABNORMAL HIGH (ref 70–99)

## 2019-07-05 LAB — PROTIME-INR
INR: 1.1 (ref 0.8–1.2)
Prothrombin Time: 14.4 seconds (ref 11.4–15.2)

## 2019-07-05 SURGERY — INSERTION, ENDOVASCULAR STENT GRAFT, AORTA, ABDOMINAL
Anesthesia: General | Site: Groin

## 2019-07-05 MED ORDER — OXYCODONE-ACETAMINOPHEN 5-325 MG PO TABS: 1.0000 | ORAL_TABLET | ORAL | Status: DC | PRN

## 2019-07-05 MED ORDER — IPRATROPIUM-ALBUTEROL 0.5-2.5 (3) MG/3ML IN SOLN
RESPIRATORY_TRACT | Status: AC
  Filled 2019-07-05: qty 3

## 2019-07-05 MED ORDER — DEXAMETHASONE SODIUM PHOSPHATE 10 MG/ML IJ SOLN
INTRAMUSCULAR | Status: AC
  Filled 2019-07-05: qty 1

## 2019-07-05 MED ORDER — LIDOCAINE 2% (20 MG/ML) 5 ML SYRINGE
INTRAMUSCULAR | Status: AC
  Filled 2019-07-05: qty 5

## 2019-07-05 MED ORDER — PROTAMINE SULFATE 10 MG/ML IV SOLN
INTRAVENOUS | Status: DC | PRN
  Administered 2019-07-05: 50 mg via INTRAVENOUS

## 2019-07-05 MED ORDER — CEFAZOLIN SODIUM-DEXTROSE 2-4 GM/100ML-% IV SOLN
INTRAVENOUS | Status: AC
  Administered 2019-07-05: 15:00:00 2 g via INTRAVENOUS
  Filled 2019-07-05: qty 100

## 2019-07-05 MED ORDER — PHENYLEPHRINE 40 MCG/ML (10ML) SYRINGE FOR IV PUSH (FOR BLOOD PRESSURE SUPPORT)
PREFILLED_SYRINGE | INTRAVENOUS | Status: DC | PRN
  Administered 2019-07-05 (×2): 80 ug via INTRAVENOUS

## 2019-07-05 MED ORDER — IPRATROPIUM-ALBUTEROL 0.5-2.5 (3) MG/3ML IN SOLN
3.0000 mL | Freq: Once | RESPIRATORY_TRACT | Status: AC
  Administered 2019-07-05: 11:00:00 3 mL via RESPIRATORY_TRACT

## 2019-07-05 MED ORDER — ESMOLOL HCL 100 MG/10ML IV SOLN
INTRAVENOUS | Status: DC | PRN
  Administered 2019-07-05: 40 mg via INTRAVENOUS
  Administered 2019-07-05: 30 mg via INTRAVENOUS

## 2019-07-05 MED ORDER — CEFAZOLIN SODIUM-DEXTROSE 2-4 GM/100ML-% IV SOLN
2.0000 g | Freq: Three times a day (TID) | INTRAVENOUS | Status: AC
  Administered 2019-07-05: 2 g via INTRAVENOUS
  Filled 2019-07-05 (×2): qty 100

## 2019-07-05 MED ORDER — DOCUSATE SODIUM 100 MG PO CAPS
100.0000 mg | ORAL_CAPSULE | Freq: Every day | ORAL | Status: DC
  Administered 2019-07-06 – 2019-07-07 (×2): 100 mg via ORAL
  Filled 2019-07-05 (×2): qty 1

## 2019-07-05 MED ORDER — SODIUM CHLORIDE 0.9 % IV SOLN: 500.0000 mL | Freq: Once | INTRAVENOUS | Status: DC | PRN

## 2019-07-05 MED ORDER — PANTOPRAZOLE SODIUM 40 MG PO TBEC
40.0000 mg | DELAYED_RELEASE_TABLET | Freq: Every day | ORAL | Status: DC
  Administered 2019-07-05 – 2019-07-07 (×3): 40 mg via ORAL
  Filled 2019-07-05 (×3): qty 1

## 2019-07-05 MED ORDER — MORPHINE SULFATE (PF) 2 MG/ML IV SOLN: 2.0000 mg | INTRAVENOUS | Status: DC | PRN

## 2019-07-05 MED ORDER — DEXAMETHASONE SODIUM PHOSPHATE 10 MG/ML IJ SOLN
INTRAMUSCULAR | Status: DC | PRN
  Administered 2019-07-05: 4 mg via INTRAVENOUS

## 2019-07-05 MED ORDER — CHLORHEXIDINE GLUCONATE CLOTH 2 % EX PADS: 6.0000 | MEDICATED_PAD | Freq: Once | CUTANEOUS | Status: DC

## 2019-07-05 MED ORDER — ONDANSETRON HCL 4 MG/2ML IJ SOLN
INTRAMUSCULAR | Status: DC | PRN
  Administered 2019-07-05: 4 mg via INTRAVENOUS

## 2019-07-05 MED ORDER — SODIUM CHLORIDE 0.9 % IV SOLN
INTRAVENOUS | Status: AC
  Filled 2019-07-05: qty 1.2

## 2019-07-05 MED ORDER — LIDOCAINE 2% (20 MG/ML) 5 ML SYRINGE
INTRAMUSCULAR | Status: DC | PRN
  Administered 2019-07-05: 60 mg via INTRAVENOUS
  Administered 2019-07-05: 40 mg via INTRAVENOUS

## 2019-07-05 MED ORDER — LEVOTHYROXINE SODIUM 25 MCG PO TABS
125.0000 ug | ORAL_TABLET | Freq: Every day | ORAL | Status: DC
  Administered 2019-07-06 – 2019-07-07 (×2): 125 ug via ORAL
  Filled 2019-07-05 (×2): qty 1

## 2019-07-05 MED ORDER — HEPARIN SODIUM (PORCINE) 1000 UNIT/ML IJ SOLN
INTRAMUSCULAR | Status: AC
  Filled 2019-07-05: qty 1

## 2019-07-05 MED ORDER — PHENYLEPHRINE 40 MCG/ML (10ML) SYRINGE FOR IV PUSH (FOR BLOOD PRESSURE SUPPORT)
PREFILLED_SYRINGE | INTRAVENOUS | Status: AC
  Filled 2019-07-05: qty 10

## 2019-07-05 MED ORDER — CEFAZOLIN SODIUM-DEXTROSE 2-4 GM/100ML-% IV SOLN
2.0000 g | INTRAVENOUS | Status: AC
  Administered 2019-07-05: 2 g via INTRAVENOUS

## 2019-07-05 MED ORDER — CARVEDILOL 6.25 MG PO TABS
6.2500 mg | ORAL_TABLET | Freq: Two times a day (BID) | ORAL | Status: DC
  Administered 2019-07-05 – 2019-07-07 (×3): 6.25 mg via ORAL
  Filled 2019-07-05 (×4): qty 1

## 2019-07-05 MED ORDER — MINERAL OIL LIGHT 100 % EX OIL
TOPICAL_OIL | CUTANEOUS | Status: DC | PRN
  Administered 2019-07-05: 1 via TOPICAL

## 2019-07-05 MED ORDER — CARBOXYMETHYLCELLULOSE SODIUM 0.5 % OP SOLN: 1.0000 [drp] | Freq: Three times a day (TID) | OPHTHALMIC | Status: DC | PRN

## 2019-07-05 MED ORDER — GUAIFENESIN-DM 100-10 MG/5ML PO SYRP
15.0000 mL | ORAL_SOLUTION | ORAL | Status: DC | PRN
  Administered 2019-07-05: 18:00:00 15 mL via ORAL
  Filled 2019-07-05: qty 15

## 2019-07-05 MED ORDER — ALUM & MAG HYDROXIDE-SIMETH 200-200-20 MG/5ML PO SUSP: 15.0000 mL | ORAL | Status: DC | PRN

## 2019-07-05 MED ORDER — ROCURONIUM BROMIDE 10 MG/ML (PF) SYRINGE
PREFILLED_SYRINGE | INTRAVENOUS | Status: DC | PRN
  Administered 2019-07-05: 10 mg via INTRAVENOUS
  Administered 2019-07-05: 70 mg via INTRAVENOUS
  Administered 2019-07-05: 10 mg via INTRAVENOUS

## 2019-07-05 MED ORDER — CLOPIDOGREL BISULFATE 75 MG PO TABS
75.0000 mg | ORAL_TABLET | Freq: Every day | ORAL | Status: DC
  Administered 2019-07-06 – 2019-07-07 (×2): 75 mg via ORAL
  Filled 2019-07-05 (×2): qty 1

## 2019-07-05 MED ORDER — ONDANSETRON HCL 4 MG/2ML IJ SOLN: 4.0000 mg | Freq: Four times a day (QID) | INTRAMUSCULAR | Status: DC | PRN

## 2019-07-05 MED ORDER — 0.9 % SODIUM CHLORIDE (POUR BTL) OPTIME
TOPICAL | Status: DC | PRN
  Administered 2019-07-05: 1000 mL

## 2019-07-05 MED ORDER — ALBUTEROL SULFATE (2.5 MG/3ML) 0.083% IN NEBU: 2.5000 mg | INHALATION_SOLUTION | RESPIRATORY_TRACT | Status: DC | PRN

## 2019-07-05 MED ORDER — SENNOSIDES-DOCUSATE SODIUM 8.6-50 MG PO TABS: 1.0000 | ORAL_TABLET | Freq: Every evening | ORAL | Status: DC | PRN

## 2019-07-05 MED ORDER — SODIUM CHLORIDE 0.9 % IV SOLN
INTRAVENOUS | Status: DC | PRN
  Administered 2019-07-05: 500 mL

## 2019-07-05 MED ORDER — PROPOFOL 10 MG/ML IV BOLUS
INTRAVENOUS | Status: AC
  Filled 2019-07-05: qty 20

## 2019-07-05 MED ORDER — IPRATROPIUM-ALBUTEROL 0.5-2.5 (3) MG/3ML IN SOLN: 3.0000 mL | RESPIRATORY_TRACT | Status: DC

## 2019-07-05 MED ORDER — ACETAMINOPHEN 325 MG RE SUPP: 325.0000 mg | RECTAL | Status: DC | PRN

## 2019-07-05 MED ORDER — FENTANYL CITRATE (PF) 250 MCG/5ML IJ SOLN
INTRAMUSCULAR | Status: AC
  Filled 2019-07-05: qty 5

## 2019-07-05 MED ORDER — MAGNESIUM SULFATE 2 GM/50ML IV SOLN: 2.0000 g | Freq: Every day | INTRAVENOUS | Status: DC | PRN

## 2019-07-05 MED ORDER — LACTATED RINGERS IV SOLN: INTRAVENOUS | Status: DC | PRN

## 2019-07-05 MED ORDER — ROCURONIUM BROMIDE 10 MG/ML (PF) SYRINGE
PREFILLED_SYRINGE | INTRAVENOUS | Status: AC
  Filled 2019-07-05: qty 10

## 2019-07-05 MED ORDER — POTASSIUM CHLORIDE CRYS ER 20 MEQ PO TBCR: 20.0000 meq | EXTENDED_RELEASE_TABLET | Freq: Every day | ORAL | Status: DC | PRN

## 2019-07-05 MED ORDER — HEPARIN SODIUM (PORCINE) 1000 UNIT/ML IJ SOLN
INTRAMUSCULAR | Status: DC | PRN
  Administered 2019-07-05: 5000 [IU] via INTRAVENOUS
  Administered 2019-07-05: 9000 [IU] via INTRAVENOUS

## 2019-07-05 MED ORDER — HYDRALAZINE HCL 20 MG/ML IJ SOLN: 5.0000 mg | INTRAMUSCULAR | Status: DC | PRN

## 2019-07-05 MED ORDER — PHENOL 1.4 % MT LIQD: 1.0000 | OROMUCOSAL | Status: DC | PRN

## 2019-07-05 MED ORDER — PHENYLEPHRINE HCL-NACL 10-0.9 MG/250ML-% IV SOLN
INTRAVENOUS | Status: DC | PRN
  Administered 2019-07-05: 40 ug/min via INTRAVENOUS

## 2019-07-05 MED ORDER — ONDANSETRON HCL 4 MG/2ML IJ SOLN
INTRAMUSCULAR | Status: AC
  Filled 2019-07-05: qty 2

## 2019-07-05 MED ORDER — IODIXANOL 320 MG/ML IV SOLN
INTRAVENOUS | Status: DC | PRN
  Administered 2019-07-05: 65 mL

## 2019-07-05 MED ORDER — HEMOSTATIC AGENTS (NO CHARGE) OPTIME
TOPICAL | Status: DC | PRN
  Administered 2019-07-05: 1 via TOPICAL

## 2019-07-05 MED ORDER — LISINOPRIL 10 MG PO TABS
10.0000 mg | ORAL_TABLET | Freq: Every day | ORAL | Status: DC
  Administered 2019-07-05 – 2019-07-07 (×2): 10 mg via ORAL
  Filled 2019-07-05 (×3): qty 1

## 2019-07-05 MED ORDER — ASPIRIN EC 81 MG PO TBEC
81.0000 mg | DELAYED_RELEASE_TABLET | Freq: Every day | ORAL | Status: DC
  Administered 2019-07-06 – 2019-07-07 (×2): 81 mg via ORAL
  Filled 2019-07-05 (×2): qty 1

## 2019-07-05 MED ORDER — BISACODYL 10 MG RE SUPP: 10.0000 mg | Freq: Every day | RECTAL | Status: DC | PRN

## 2019-07-05 MED ORDER — SUGAMMADEX SODIUM 200 MG/2ML IV SOLN
INTRAVENOUS | Status: DC | PRN
  Administered 2019-07-05: 200 mg via INTRAVENOUS

## 2019-07-05 MED ORDER — METOPROLOL TARTRATE 5 MG/5ML IV SOLN: 2.0000 mg | INTRAVENOUS | Status: DC | PRN

## 2019-07-05 MED ORDER — ROSUVASTATIN CALCIUM 20 MG PO TABS
40.0000 mg | ORAL_TABLET | Freq: Every evening | ORAL | Status: DC
  Administered 2019-07-05 – 2019-07-06 (×2): 40 mg via ORAL
  Filled 2019-07-05 (×2): qty 2

## 2019-07-05 MED ORDER — GABAPENTIN 300 MG PO CAPS
300.0000 mg | ORAL_CAPSULE | Freq: Every day | ORAL | Status: DC
  Administered 2019-07-05 – 2019-07-07 (×9): 300 mg via ORAL
  Filled 2019-07-05 (×9): qty 1

## 2019-07-05 MED ORDER — SODIUM CHLORIDE 0.9 % IV SOLN: INTRAVENOUS | Status: DC

## 2019-07-05 MED ORDER — PROPOFOL 10 MG/ML IV BOLUS
INTRAVENOUS | Status: DC | PRN
  Administered 2019-07-05: 100 mg via INTRAVENOUS

## 2019-07-05 MED ORDER — ACETAMINOPHEN 325 MG PO TABS: 325.0000 mg | ORAL_TABLET | ORAL | Status: DC | PRN

## 2019-07-05 MED ORDER — LABETALOL HCL 5 MG/ML IV SOLN: 10.0000 mg | INTRAVENOUS | Status: DC | PRN

## 2019-07-05 MED ORDER — SUGAMMADEX SODIUM 200 MG/2ML IV SOLN: INTRAVENOUS | Status: DC | PRN

## 2019-07-05 MED ORDER — FENTANYL CITRATE (PF) 250 MCG/5ML IJ SOLN
INTRAMUSCULAR | Status: DC | PRN
  Administered 2019-07-05 (×2): 50 ug via INTRAVENOUS
  Administered 2019-07-05: 100 ug via INTRAVENOUS

## 2019-07-05 SURGICAL SUPPLY — 69 items
BLADE CLIPPER SURG (BLADE) ×4 IMPLANT
CANISTER SUCT 3000ML PPV (MISCELLANEOUS) ×4 IMPLANT
CATH BEACON 5.038 65CM KMP-01 (CATHETERS) ×4 IMPLANT
CATH OMNI FLUSH .035X70CM (CATHETERS) ×4 IMPLANT
CLIP VESOCCLUDE MED 6/CT (CLIP) ×12 IMPLANT
CLIP VESOCCLUDE SM WIDE 6/CT (CLIP) ×4 IMPLANT
COVER WAND RF STERILE (DRAPES) IMPLANT
DERMABOND ADVANCED (GAUZE/BANDAGES/DRESSINGS) ×4
DERMABOND ADVANCED .7 DNX12 (GAUZE/BANDAGES/DRESSINGS) ×4 IMPLANT
DEVICE CLOSURE PERCLS PRGLD 6F (VASCULAR PRODUCTS) ×8 IMPLANT
DEVICE TORQUE KENDALL .025-038 (MISCELLANEOUS) IMPLANT
DRSG TEGADERM 2-3/8X2-3/4 SM (GAUZE/BANDAGES/DRESSINGS) IMPLANT
DRSG TEGADERM 4X4.75 (GAUZE/BANDAGES/DRESSINGS) ×4 IMPLANT
DRYSEAL FLEXSHEATH 16FR 33CM (SHEATH) ×2
ELECT REM PT RETURN 9FT ADLT (ELECTROSURGICAL) ×8
ELECTRODE REM PT RTRN 9FT ADLT (ELECTROSURGICAL) ×4 IMPLANT
EXCLDR TRNK 28.5X14.5X12 16F (Endovascular Graft) ×4 IMPLANT
EXCLUDER TNK 28.5X14.5X12 16F (Endovascular Graft) ×2 IMPLANT
GAUZE SPONGE 2X2 8PLY STRL LF (GAUZE/BANDAGES/DRESSINGS) ×2 IMPLANT
GLIDEWIRE ADV .035X180CM (WIRE) ×8 IMPLANT
GLOVE BIO SURGEON STRL SZ7.5 (GLOVE) ×4 IMPLANT
GOWN STRL REUS W/ TWL LRG LVL3 (GOWN DISPOSABLE) ×4 IMPLANT
GOWN STRL REUS W/ TWL XL LVL3 (GOWN DISPOSABLE) ×4 IMPLANT
GOWN STRL REUS W/TWL LRG LVL3 (GOWN DISPOSABLE) ×4
GOWN STRL REUS W/TWL XL LVL3 (GOWN DISPOSABLE) ×4
GRAFT BALLN CATH 65CM (STENTS) ×2 IMPLANT
GUIDEWIRE ANGLED .035X150CM (WIRE) IMPLANT
HEMOSTAT SNOW SURGICEL 2X4 (HEMOSTASIS) ×4 IMPLANT
KIT BASIN OR (CUSTOM PROCEDURE TRAY) ×4 IMPLANT
KIT TURNOVER KIT B (KITS) ×4 IMPLANT
LEG CONTRALATERAL 16X12X14 (Vascular Products) ×4 IMPLANT
LOOP VESSEL MAXI BLUE (MISCELLANEOUS) ×8 IMPLANT
LOOP VESSEL MINI RED (MISCELLANEOUS) ×4 IMPLANT
NEEDLE PERC 18GX7CM (NEEDLE) ×4 IMPLANT
NS IRRIG 1000ML POUR BTL (IV SOLUTION) ×4 IMPLANT
PACK ENDOVASCULAR (PACKS) ×4 IMPLANT
PAD ARMBOARD 7.5X6 YLW CONV (MISCELLANEOUS) IMPLANT
PENCIL BUTTON HOLSTER BLD 10FT (ELECTRODE) ×4 IMPLANT
PERCLOSE PROGLIDE 6F (VASCULAR PRODUCTS) ×16
SET MICROPUNCTURE 5F STIFF (MISCELLANEOUS) ×4 IMPLANT
SHEATH BRITE TIP 8FR 23CM (SHEATH) ×4 IMPLANT
SHEATH DRYSEAL FLEX 16FR 33CM (SHEATH) ×2 IMPLANT
SHEATH PINNACLE 8F 10CM (SHEATH) ×4 IMPLANT
SPONGE GAUZE 2X2 STER 10/PKG (GAUZE/BANDAGES/DRESSINGS) ×2
STENT GRAFT BALLN CATH 65CM (STENTS) ×2
STENT GRAFT CONTRALAT 16X12X14 (Vascular Products) ×4 IMPLANT
STOPCOCK MORSE 400PSI 3WAY (MISCELLANEOUS) ×4 IMPLANT
SUT MNCRL AB 4-0 PS2 18 (SUTURE) ×8 IMPLANT
SUT PROLENE 5 0 C 1 24 (SUTURE) ×20 IMPLANT
SUT PROLENE 6 0 BV (SUTURE) ×4 IMPLANT
SUT SILK 2 0 (SUTURE) ×2
SUT SILK 2-0 18XBRD TIE 12 (SUTURE) ×2 IMPLANT
SUT SILK 3 0 (SUTURE) ×2
SUT SILK 3-0 18XBRD TIE 12 (SUTURE) ×2 IMPLANT
SUT SILK 4 0 (SUTURE) ×2
SUT SILK 4-0 18XBRD TIE 12 (SUTURE) ×2 IMPLANT
SUT VIC AB 2-0 CT1 27 (SUTURE)
SUT VIC AB 2-0 CT1 TAPERPNT 27 (SUTURE) IMPLANT
SUT VIC AB 2-0 CTX 36 (SUTURE) ×4 IMPLANT
SUT VIC AB 3-0 SH 27 (SUTURE) ×2
SUT VIC AB 3-0 SH 27X BRD (SUTURE) ×2 IMPLANT
SYR 20ML LL LF (SYRINGE) ×4 IMPLANT
SYR BULB IRRIGATION 50ML (SYRINGE) ×4 IMPLANT
TOWEL GREEN STERILE (TOWEL DISPOSABLE) ×4 IMPLANT
TRAY FOLEY MTR SLVR 16FR STAT (SET/KITS/TRAYS/PACK) ×4 IMPLANT
TUBING INJECTOR 48 (MISCELLANEOUS) ×4 IMPLANT
WIRE AMPLATZ SS-J .035X180CM (WIRE) ×8 IMPLANT
WIRE BENTSON .035X145CM (WIRE) ×8 IMPLANT
WIRE TORQFLEX AUST .018X40CM (WIRE) ×12 IMPLANT

## 2019-07-05 NOTE — Anesthesia Postprocedure Evaluation (Signed)
Anesthesia Post Note  Patient: Cathy Haney  Procedure(s) Performed: ABDOMINAL AORTIC ENDOVASCULAR STENT GRAFT (N/A Groin) Right Endarterectomy Femoral (Groin)     Patient location during evaluation: PACU Anesthesia Type: General Level of consciousness: awake and alert Pain management: pain level controlled Vital Signs Assessment: post-procedure vital signs reviewed and stable Respiratory status: spontaneous breathing, nonlabored ventilation, respiratory function stable and patient connected to nasal cannula oxygen Cardiovascular status: blood pressure returned to baseline and stable Postop Assessment: no apparent nausea or vomiting Anesthetic complications: no    Last Vitals:  Vitals:   07/05/19 1320 07/05/19 1351  BP: (!) 103/58 (!) 144/50  Pulse: 62 68  Resp: 13 17  Temp: 36.8 C 36.5 C  SpO2: 95% 96%    Last Pain:  Vitals:   07/05/19 1351  TempSrc: Oral  PainSc:                  [name]

## 2019-07-05 NOTE — H&P (Signed)
   History and Physical Update  The patient was interviewed and re-examined.  The patient's previous History and Physical has been reviewed and is unchanged from recent office visit. Plan for EVAR. She has undergone cardiac catheterization for clearance. Risks and benefits discussed and she agrees to proceed.   Beauty Pless C. Randie Heinz, MD [facility] [place] Office: [phone] Pager: [phone]  07/05/2019, 7:22 AM

## 2019-07-05 NOTE — Anesthesia Procedure Notes (Addendum)
Procedure Name: Intubation Date/Time: 07/05/2019 7:46 AM Performed by: Milford Cage, CRNA Pre-anesthesia Checklist: Patient identified, Emergency Drugs available, Suction available and Patient being monitored Patient Re-evaluated:Patient Re-evaluated prior to induction Oxygen Delivery Method: Circle system utilized and Simple face mask Preoxygenation: Pre-oxygenation with 100% oxygen Induction Type: IV induction Ventilation: Mask ventilation without difficulty Laryngoscope Size: Mac and 3 Grade View: Grade I Tube type: Oral Tube size: 7.0 mm Number of attempts: 1 Placement Confirmation: ETT inserted through vocal cords under direct vision,  positive ETCO2,  breath sounds checked- equal and bilateral and CO2 detector Secured at: 22 cm Tube secured with: Tape Dental Injury: Teeth and Oropharynx as per pre-operative assessment  Comments: Performed by Francee Piccolo

## 2019-07-05 NOTE — Op Note (Signed)
Patient name: Cathy Haney MRN: [id number] DOB: February 07, 1951 Sex: female  07/05/2019 Pre-operative Diagnosis: AAA Post-operative diagnosis:  Same Surgeon:  Apolinar Junes C. Randie Heinz, MD Assistant: Wendi Maya, PA Procedure Performed: 1.  Percutaneous access and closure left common femoral artery 2.  Endovascular aneurysm repair with Gore conformable excluder main body right 28 x 14 x 12cm extended with 12 x 14 cm limb and contralateral limb left 12 x 14 cm 3.  Right external iliac, common femoral, SFA and profunda femoral endarterectomy with vein patch angioplasty 4.  Harvest of right greater saphenous vein  Indications: 69 year old female with abdominal aortic aneurysm.  She now meets criteria for repair and is indicated for endovascular aneurysm repair.  Findings: Right common femoral artery was heavily diseased.  We attempted to cannulate this percutaneously but with both micropuncture needle and 18-gauge needle I could not.  We then cannulated this open.  At completion there was significant disease minimal flow in the common femoral artery.  An endarterectomy was performed extending up to the external iliac artery down to the profunda and SFA.  We harvested a 4 mm saphenous vein and use this as a vein patch.  Left common femoral artery was small although a wire did pass easily a 12 French sheath was placed there.  The SFA and left renal artery were at the same level the right renal artery was 2 or 7 mm lower.  We did obtain good seal with the conformable excluder after conforming it to the neck.  We extended on both sides of the common iliac arteries and there was flow in both hypogastric arteries.  At completion there were bilateral posterior tibial artery signals at the ankles and the feet were warm and well-perfused.   Procedure:  The patient was identified in the holding area and taken to the operating room where she is placed upon operative when general anesthesia was induced.  She was gently  prepped draped in the usual fashion antibiotics were minister timeout was called.  Ultrasound was used to identify the right common femoral artery which was noted to be heavily diseased particularly posteriorly but also was diminutive.  Transverse incision was made I separated the subcutaneous tissue with hemostat.  I then cannulated with a micropuncture needle.  I did have good blood return but could not get a wire to pass.  I used multiple wires.  Ultimately I used an 18-gauge needle.  I cannot get a Bentson wire to pass.  I turned my attention to the left groin.  I cannulated this with micropuncture needle and placed a wire and sheath.  I then placed a Bentson wire to the common iliac artery where it encountered disease as well.  I then placed an 8 French sheath dilator.  I deployed to ProGlide devices and then placed an 8 [place] sheath.  Using a Glidewire advantage I was able to get a catheter into the aorta itself and placed a Glidewire advantage into the descending thoracic aorta.  I again turned my attention to the right common femoral artery.  I attempted one more time with percutaneous access with micropuncture needle but again a wire would not pass.  I then extended my skin incision medially and laterally.  I dissected down sharply with cautery and scissors to the common femoral artery.  This was heavily diseased.  There was 1 area that was bleeding from cannulation I placed a 5-0 figure-of-eight stitch.  I then dissected up onto the inguinal ligament.  I  placed a vessel loop around this.  I dissected down to the profunda and SFA and placed Vesseloops around these.  I then cannulated the common femoral artery in 1 soft area with an 18-gauge needle and placed a Bentson wire.  Again the wire would not pass far but I was able to get an 8 [place] sheath in place.  I used a Glidewire advantage to get into the aorta.  I used a bare catheter to direct this into the descending thoracic aorta.  I then exchanged for  a 16 French sheath.  This was placed under fluoroscopic guidance the patient was fully heparinized.  The main body device was then coated with mineral oil in place to the level of L1.  An Omni catheter was placed from the left side and aortogram performed.  We then deployed our main body from the right side.  I took an additional view as the initial view was angled the SMA appeared to obscure the right renal artery.  I now did an anterior view.  I then angled the neck of the graft after reconstrained.  I then left the constraints out.  From the left side I then cannulated the gate.  I was able to tour an Omni catheter inside the body.  I then placed a Glidewire advantage again into the descending thoracic aorta.  I performed retrograde angiography from the left side.  I then introduced the contralateral limb and deployed this which was 12 x 14 cm's.  This ended a few centimeters above the left hypogastric.  We then deployed the entirety of the main body.  I performed retrograde angiography and then extended with a 12 x 14 cm limb.  All of these were ballooned out as the bifurcation was quite tight.  An Omni catheter was again placed to level L1 and completion angiography demonstrated only a possible late type II endoleak from 2 lumbar arteries.  There were no type I and both renals and the SMA filled briskly.  Both hypogastric arteries also filled briskly both sheaths were occlusive to the external iliac arteries.  Satisfied with this we turned our attention to the left groin.  The sheath was removed and we cinched both Pro-glide devices.  She did have good signal at the left posterior tibial artery.  We had good hemostasis there and pressure was maintained with hemostat at the level of the skin.  We then turned our attention of the right common femoral artery.  I removed my sheath and clamped the external leg artery as well as the SFA and profunda.  I then opened the vessel longitudinally.  I encountered  significant heaped up soft and calcific disease.  I performed extensive endarterectomy including up onto the external iliac artery under the inguinal ligament.  We also endarterectomized the profunda down and the SFA.  We had good backbleeding from both of these vessels.  I then to the same incision identified the saphenofemoral junction.  I dissected out the vein for approximately 8 cm.  I transected it distally and tied it off.  I clipped branches.  At the saphenofemoral junction I placed a side-biting clamp and excised the vein.  I oversewed the saphenofemoral junction with running 5-0 Prolene suture in a mattress fashion.  I then opened the veins longitudinally.  I then sewed in place as a patch angioplasty with 5-0 Prolene suture.  Prior completion allowed flushing all directions.  Upon completion there were 2 areas of bleeding I placed interrupted 5-0  Prolene sutures and hemostasis was obtained.  I check signals with Doppler in the wound bed at the foot and they were adequate.  50 mg of protamine was administered.  We obtain hemostasis in the wound and irrigated.  The right groin wound was closed with Vicryl and Monocryl and Dermabond placed to the level of the skin.  The left groin skin incision was closed with 4 Monocryl and Dermabond was placed to the level of the skin.  She was then awakened anesthesia having tolerated procedure without any complication.  All counts were correct at completion.  EBL: 500cc   Kano Heckmann C. Randie Heinz, MD [facility] [place] Office: [phone] Pager: [phone]

## 2019-07-05 NOTE — Anesthesia Procedure Notes (Signed)
Arterial Line Insertion Start/End4/10/2019 7:00 AM, 07/05/2019 7:15 AM Performed by: Ezekiel Ina, CRNA, CRNA  Patient location: Pre-op. Preanesthetic checklist: patient identified, IV checked, site marked, risks and benefits discussed, surgical consent, monitors and equipment checked, pre-op evaluation and anesthesia consent Lidocaine 1% used for infiltration Left, radial was placed Catheter size: 20 G Hand hygiene performed , maximum sterile barriers used  and Seldinger technique used  Attempts: 2 Procedure performed without using ultrasound guided technique. Ultrasound Notes:anatomy identified, needle tip was noted to be adjacent to the nerve/plexus identified and no ultrasound evidence of intravascular and/or intraneural injection Following insertion, dressing applied and Biopatch. Post procedure assessment: normal and unchanged  Patient tolerated the procedure well with no immediate complications.

## 2019-07-05 NOTE — Progress Notes (Signed)
  Day of Surgery Note    Subjective:  Only complaint is some right groin soreness and foley cath not comfortable. Tolerating diet   Vitals:   07/05/19 1400 07/05/19 1500  BP: (!) 147/59 (!) 162/82  Pulse: 68 74  Resp: 15 18  Temp:  98 F (36.7 C)  SpO2: 98% 97%    Incisions:   Right groin incision well approx. Without hematoma. Left groin puncture without hematoma Extremities:  Feet warm and well perfused Cardiac:  RRR Lungs:  nonlabored Abdomen:  Soft, NT   Assessment/Plan:  This is a 69 y.o. female who is s/p  EVAR with right common femoral endarterectomy/ vein patch angioplasty. BP up slightly. Oral meds given  -Stable post-op   Wendi Maya, PA-C 07/05/2019 3:25 PM [phone]

## 2019-07-05 NOTE — Transfer of Care (Signed)
Immediate Anesthesia Transfer of Care Note  Patient: Cathy Haney  Procedure(s) Performed: ABDOMINAL AORTIC ENDOVASCULAR STENT GRAFT (N/A Groin) Right Endarterectomy Femoral (Groin)  Patient Location: PACU  Anesthesia Type:General  Level of Consciousness: awake, alert  and oriented  Airway & Oxygen Therapy: Patient Spontanous Breathing  Post-op Assessment: Report given to RN  Post vital signs: Reviewed and stable  Last Vitals:  Vitals Value Taken Time  BP 114/92 07/05/19 1107  Temp    Pulse 66 07/05/19 1111  Resp 11 07/05/19 1111  SpO2 96 % 07/05/19 1111  Vitals shown include unvalidated device data.  Last Pain:  Vitals:   07/05/19 0601  TempSrc:   PainSc: 0-No pain         Complications: No apparent anesthesia complications

## 2019-07-06 ENCOUNTER — Encounter: Payer: Self-pay | Admitting: *Deleted

## 2019-07-06 DIAGNOSIS — E039 Hypothyroidism, unspecified: Secondary | ICD-10-CM

## 2019-07-06 HISTORY — DX: Hypothyroidism, unspecified: E03.9

## 2019-07-06 LAB — BASIC METABOLIC PANEL
Anion gap: 11 (ref 5–15)
BUN: 21 mg/dL (ref 8–23)
CO2: 21 mmol/L — ABNORMAL LOW (ref 22–32)
Calcium: 8.9 mg/dL (ref 8.9–10.3)
Chloride: 103 mmol/L (ref 98–111)
Creatinine, Ser: 0.92 mg/dL (ref 0.44–1.00)
GFR calc Af Amer: >60 mL/min (ref >60)
GFR calc non Af Amer: >60 mL/min (ref >60)
Glucose, Bld: 164 mg/dL — ABNORMAL HIGH (ref 70–99)
Potassium: 4.2 mmol/L (ref 3.5–5.1)
Sodium: 135 mmol/L (ref 135–145)

## 2019-07-06 LAB — CBC
HCT: 27.5 % — ABNORMAL LOW (ref 36.0–46.0)
Hemoglobin: 9.1 g/dL — ABNORMAL LOW (ref 12.0–15.0)
MCH: 30.7 pg (ref 26.0–34.0)
MCHC: 33.1 g/dL (ref 30.0–36.0)
MCV: 92.9 fL (ref 80.0–100.0)
Platelets: 208 10*3/uL (ref 150–400)
RBC: 2.96 MIL/uL — ABNORMAL LOW (ref 3.87–5.11)
RDW: 13.3 % (ref 11.5–15.5)
WBC: 14 10*3/uL — ABNORMAL HIGH (ref 4.0–10.5)
nRBC: 0 % (ref 0.0–0.2)

## 2019-07-06 MED ORDER — SODIUM CHLORIDE 0.45 % IV SOLN: INTRAVENOUS | Status: DC

## 2019-07-06 NOTE — Progress Notes (Signed)
Pt sitting up in recliner, c/o feeling flushed and diaphoretic.  BP 89/49, HR 80, 98% RA, RR 19, Temp 98.3.  Pt assisted back to bed, BP reassessed 102/46.  Morning lisinopril and carvedilol held.  Also noticed a darkened area on pt's R upper inner thigh area.  It is soft to palpation, not raised or painful, pt states it is numb.  Probably just bruising.  Otherwise pt is comfortable, resting in bed at this time. Vascular PA notified and asked to come reassess before pt is discharged today.  Will continue to monitor in the meantime.

## 2019-07-06 NOTE — Discharge Instructions (Signed)
v[facility]  [facility]   Discharge Instructions  Endovascular Aortic Aneurysm Repair  Please refer to the following instructions for your post-procedure care. Your surgeon or Physician Assistant will discuss any changes with you.  Activity  You are encouraged to walk as much as you can. You can slowly return to normal activities but must avoid strenuous activity and heavy lifting until your doctor tells you it's OK. Avoid activities such as vacuuming or swinging a gold club. It is normal to feel tired for several weeks after your surgery. Do not drive until your doctor gives the OK and you are no longer taking prescription pain medications. It is also normal to have difficulty with sleep habits, eating, and bowel movements after surgery. These will go away with time.  Bathing/Showering  You may shower after you go home. If you have an incision, do not soak in a bathtub, hot tub, or swim until the incision heals completely.  Incision Care  Shower every day. Clean your incision with mild soap and water. Pat the area dry with a clean towel. You do not need a bandage unless otherwise instructed. Do not apply any ointments or creams to your incision. If you clothing is irritating, you may cover your incision with a dry gauze pad.  Diet  Resume your normal diet. There are no special food restrictions following this procedure. A low fat/low cholesterol diet is recommended for all patients with vascular disease. In order to heal from your surgery, it is CRITICAL to get adequate nutrition. Your body requires vitamins, minerals, and protein. Vegetables are the best source of vitamins and minerals. Vegetables also provide the perfect balance of protein. Processed food has little nutritional value, so try to avoid this.  Medications  Resume taking all of your medications unless your doctor or nurse practitioner tells you not to. If your incision is causing pain, you may take  over-the-counter pain relievers such as acetaminophen (Tylenol). If you were prescribed a stronger pain medication, please be aware these medications can cause nausea and constipation. Prevent nausea by taking the medication with a snack or meal. Avoid constipation by drinking plenty of fluids and eating foods with a high amount of fiber, such as fruits, vegetables, and grains. Do not take Tylenol if you are taking prescription pain medications.   Follow up  Our office will schedule a follow-up appointment with a C.T. scan 3-4 weeks after your surgery.  Please call us immediately for any of the following conditions  Severe or worsening pain in your legs or feet or in your abdomen back or chest. Increased pain, redness, drainage (pus) from your incision sit. Increased abdominal pain, bloating, nausea, vomiting or persistent diarrhea. Fever of 101 degrees or higher. Swelling in your leg (s),  Reduce your risk of vascular disease  .Stop smoking. If you would like help call [facility] at 1-800-QUIT-NOW (([phone] 584-7321) or [place] at [phone]. .Manage your cholesterol .Maintain a desired weight .Control your diabetes .Keep your blood pressure down  If you have questions, please call the office at [phone].

## 2019-07-06 NOTE — Progress Notes (Addendum)
Progress Note    07/06/2019 7:31 AM 1 Day Post-Op  Subjective: Out of bed to chair this morning.  No specific complaints.  She has chronically sore back.  Also states she has chronically weak lower extremities.  Foley has been removed but she has yet to void spontaneously.  Tolerating diet.  Denies chest pain, abdomen pain, SOB, nausea or vomiting   Vitals:   07/06/19 0400 07/06/19 0429  BP:  (!) 108/48  Pulse:  82  Resp:  17  Temp:  98.8 F (37.1 C)  SpO2: 93% 94%    Physical Exam: Cardiac: Heart rate and rhythm are regular Lungs: Clear to auscultation bilaterally Incisions: Right groin incision is well approximated without bleeding or hematoma.  Left groin puncture site also without hematoma. Extremities: Both feet are warm and well perfused.  Active range of motion.  Sensation intact. Abdomen: Soft, nondistended.  Normoactive bowel sounds  CBC    Component Value Date/Time   WBC 14.0 (H) 07/06/2019 0434   RBC 2.96 (L) 07/06/2019 0434   HGB 9.1 (L) 07/06/2019 0434   HGB 12.1 06/22/2019 0942   HCT 27.5 (L) 07/06/2019 0434   HCT 36.5 06/22/2019 0942   PLT 208 07/06/2019 0434   PLT 227 06/22/2019 0942   MCV 92.9 07/06/2019 0434   MCV 94 06/22/2019 0942   MCH 30.7 07/06/2019 0434   MCHC 33.1 07/06/2019 0434   RDW 13.3 07/06/2019 0434   RDW 13.2 06/22/2019 0942    BMET    Component Value Date/Time   NA 135 07/06/2019 0434   NA 137 06/22/2019 0942   K 4.2 07/06/2019 0434   CL 103 07/06/2019 0434   CO2 21 (L) 07/06/2019 0434   GLUCOSE 164 (H) 07/06/2019 0434   BUN 21 07/06/2019 0434   BUN 13 06/22/2019 0942   CREATININE 0.92 07/06/2019 0434   CALCIUM 8.9 07/06/2019 0434   GFRNONAA >60 07/06/2019 0434   GFRAA >60 07/06/2019 0434     Intake/Output Summary (Last 24 hours) at 07/06/2019 0731 Last data filed at 07/06/2019 7106 Gross per 24 hour  Intake 1790 ml  Output 2500 ml  Net -710 ml    HOSPITAL MEDICATIONS Scheduled Meds: . aspirin EC  81 mg Oral  Daily  . carvedilol  6.25 mg Oral BID WC  . clopidogrel  75 mg Oral Q0600  . docusate sodium  100 mg Oral Daily  . gabapentin  300 mg Oral 5 X Daily  . levothyroxine  125 mcg Oral QAC breakfast  . lisinopril  10 mg Oral Daily  . pantoprazole  40 mg Oral Daily  . rosuvastatin  40 mg Oral QPM   Continuous Infusions: . sodium chloride    . sodium chloride    . magnesium sulfate bolus IVPB     PRN Meds:.sodium chloride, acetaminophen **OR** acetaminophen, albuterol, alum & mag hydroxide-simeth, bisacodyl, guaiFENesin-dextromethorphan, hydrALAZINE, labetalol, magnesium sulfate bolus IVPB, metoprolol tartrate, morphine injection, ondansetron, oxyCODONE-acetaminophen, phenol, potassium chloride, senna-docusate  Assessment:  69 y.o. female is s/p:  EVAR, right common femoral endarterectomy.  Hemoglobin stable.  Creatinine at baseline.  1 Day Post-Op  Plan: -Continue observation this morning, mobilization.  Likely home this afternoon  -DVT prophylaxis: SCDs   Wendi Maya, PA-C [facility] [phone] 07/06/2019  7:31 AM   I have independently interviewed and examined patient and agree with PA assessment and plan above. Had some issues getting out of bed this a.m.Marland Kitchen will plan to observe overnight and evaluate for discharge tomorrow.  Chevy Virgo C. Donzetta Matters, MD [facility] [place] Office: [phone] Pager: [phone]

## 2019-07-06 NOTE — Progress Notes (Addendum)
MOBILITY TEAM - Progress Note   07/06/19 1335  Mobility  Activity Ambulated in hall  Level of Assistance Standby assist, set-up cues, supervision of patient - no hands on  Assistive Device None  Distance Ambulated (ft) 210 ft  Mobility Response Tolerated well  Mobility performed by Mobility specialist  Bed Position Chair   Patient motivated to participate and moving well; distance limited by LE pain and fatigue. Pt with initial hypotension when sitting and standing, remained asymptomatic with mobility (see values below).  Orthostatic BPs Supine 109/43  Sitting 98/55  Standing 93/63  Standing after 2 min 109/64  Post-ambulation 121/64    Ina Homes, PT, DPT Mobility Team Pager [phone]

## 2019-07-06 NOTE — Progress Notes (Signed)
Paged by RN when patient became hypotensive upon getting out of bed to chair. Felt flushed and was diaphoretic. Antihypertensive meds held.  I resumed IVFs. Currently without complaints and says at home she will experience near-syncope if she gets up too quickly.  She denies CP or SOB.  Noticed small area of ecchymosis of right upper inner thigh. This area is soft and nontender. Right groin incision remains soft.  VS at 1000:  Blood pressure (!) 114/41, pulse 81, temperature 98.3 F (36.8 C), temperature source Oral, resp. rate 16, height 5\' 6"  (1.676 m), weight 90.7 kg, SpO2 95 %.

## 2019-07-07 MED ORDER — CLOPIDOGREL BISULFATE 75 MG PO TABS: 75.0000 mg | ORAL_TABLET | Freq: Every day | ORAL | 5 refills | Status: DC

## 2019-07-07 MED ORDER — OXYCODONE-ACETAMINOPHEN 5-325 MG PO TABS: 1.0000 | ORAL_TABLET | Freq: Four times a day (QID) | ORAL | 0 refills | Status: DC | PRN

## 2019-07-07 NOTE — Progress Notes (Addendum)
  Progress Note    07/07/2019 9:02 AM 2 Days Post-Op  Subjective:  Ready for discharge home today.  Patient states she feels much better than yesterday   Vitals:   07/07/19 0444 07/07/19 0842  BP: (!) 125/57 (!) 142/76  Pulse: 94 63  Resp: 19 (!) 22  Temp: 98.9 F (37.2 C) 99.4 F (37.4 C)  SpO2: 96% 98%   Physical Exam: Lungs:  Non labored Incisions:  R groin incision c/d/i; L groin cath site soft Extremities:  Palpable R DP; L foot warm and well perfused Abdomen:  Soft, NT, ND Neurologic: A&O  CBC    Component Value Date/Time   WBC 14.0 (H) 07/06/2019 0434   RBC 2.96 (L) 07/06/2019 0434   HGB 9.1 (L) 07/06/2019 0434   HGB 12.1 06/22/2019 0942   HCT 27.5 (L) 07/06/2019 0434   HCT 36.5 06/22/2019 0942   PLT 208 07/06/2019 0434   PLT 227 06/22/2019 0942   MCV 92.9 07/06/2019 0434   MCV 94 06/22/2019 0942   MCH 30.7 07/06/2019 0434   MCHC 33.1 07/06/2019 0434   RDW 13.3 07/06/2019 0434   RDW 13.2 06/22/2019 0942    BMET    Component Value Date/Time   NA 135 07/06/2019 0434   NA 137 06/22/2019 0942   K 4.2 07/06/2019 0434   CL 103 07/06/2019 0434   CO2 21 (L) 07/06/2019 0434   GLUCOSE 164 (H) 07/06/2019 0434   BUN 21 07/06/2019 0434   BUN 13 06/22/2019 0942   CREATININE 0.92 07/06/2019 0434   CALCIUM 8.9 07/06/2019 0434   GFRNONAA >60 07/06/2019 0434   GFRAA >60 07/06/2019 0434    INR    Component Value Date/Time   INR 1.1 07/05/2019 1140     Intake/Output Summary (Last 24 hours) at 07/07/2019 0902 Last data filed at 07/07/2019 0646 Gross per 24 hour  Intake 360 ml  Output 1400 ml  Net -1040 ml     Assessment/Plan:  69 y.o. female is s/p EVAR with R CFA endarterectomy and profundoplasty 2 Days Post-Op   BLE well perfused Ok for discharge home this morning Office will arrange CTA abd/pelvis in 3 weeks to see Dr. Whitman Hero, PA-C [facility] [phone] 07/07/2019 9:02 AM  I have independently  interviewed and examined patient and agree with PA assessment and plan above. She is on aspirin and crestor.   Dannielle Baskins C. Randie Heinz, MD [facility] [place] Office: [phone] Pager: [phone]

## 2019-07-09 ENCOUNTER — Encounter: Payer: Self-pay | Admitting: Cardiology

## 2019-07-09 ENCOUNTER — Ambulatory Visit (INDEPENDENT_AMBULATORY_CARE_PROVIDER_SITE_OTHER): Payer: Medicare Other | Admitting: Cardiology

## 2019-07-09 ENCOUNTER — Other Ambulatory Visit: Payer: Self-pay

## 2019-07-09 VITALS — BP 138/64 | HR 78 | Ht 66.0 in | Wt 196.0 lb

## 2019-07-09 DIAGNOSIS — I714 Abdominal aortic aneurysm, without rupture, unspecified: Secondary | ICD-10-CM

## 2019-07-09 DIAGNOSIS — E669 Obesity, unspecified: Secondary | ICD-10-CM

## 2019-07-09 DIAGNOSIS — I10 Essential (primary) hypertension: Secondary | ICD-10-CM | POA: Diagnosis not present

## 2019-07-09 DIAGNOSIS — I2510 Atherosclerotic heart disease of native coronary artery without angina pectoris: Secondary | ICD-10-CM | POA: Diagnosis not present

## 2019-07-09 DIAGNOSIS — R7303 Prediabetes: Secondary | ICD-10-CM

## 2019-07-09 DIAGNOSIS — E782 Mixed hyperlipidemia: Secondary | ICD-10-CM | POA: Diagnosis not present

## 2019-07-09 DIAGNOSIS — I2541 Coronary artery aneurysm: Secondary | ICD-10-CM

## 2019-07-09 NOTE — Progress Notes (Signed)
Cardiology Office Note:    Date:  07/09/2019   ID:  Cathy Haney, DOB June 16, 1950, MRN [id number]  PCP:  Maris Berger, MD  Cardiologist:  Norman Herrlich, MD  Electrophysiologist:  None   Referring MD: Maris Berger, MD   Chief Complaint  Patient presents with  . Follow-up    History of Present Illness:    Cathy Haney is a 69 y.o. female with a hx of coronary artery disease recent left heart cath which also showed saccular LAD proximal aneurysm, abdominal   aortic aneurysm status post EVAR, on July 05, 2019, hypertension, hyperlipidemia, hypothyroidism presents for follow-up visit.  She tells me that her procedure went well she actually is recovering well.  She offers no specific complaints at this time.   Past Medical History:  Diagnosis Date  . AAA (abdominal aortic aneurysm) without rupture (HCC) 06/19/2019  . Abdominal aortic aneurysm (HCC) 05/27/2017  . Acquired hypothyroidism 07/06/2019  . Allergy   . Anemia    during pregnancy  . Anxiety    hx  . Aortic aneurysm of unspecified site, without rupture (HCC) 05/30/2017  . Arthritis   . Benign essential hypertension 05/30/2017  . Benign hypertension 05/30/2017  . CAD (coronary artery disease) 06/27/2019  . Coronary artery calcification seen on CT scan   . Depression    hx has meds as needed  . Diverticulitis   . Dyspnea   . Hypothyroidism    Aquired  . Mixed hyperlipidemia   . Obesity (BMI 30-39.9) 06/19/2019  . Pneumonia    in the past  . Pre-diabetes    diet controlled, doesnt eat sweets  . Prediabetes   . Preoperative cardiovascular examination 06/01/2017  . Shortness of breath 06/19/2019  . Thyroid disease     Past Surgical History:  Procedure Laterality Date  . ABDOMINAL AORTA STENT  07/05/2019   ABDOMINAL AORTIC ENDOVASCULAR STENT GRAFT (N/A Groin)   . ABDOMINAL AORTIC ENDOVASCULAR STENT GRAFT N/A 07/05/2019   Procedure: ABDOMINAL AORTIC ENDOVASCULAR STENT GRAFT;  Surgeon: [name], MD;   Location: [facility] OR;  Service: Vascular;  Laterality: N/A;  . BACK SURGERY     khyphoplasty  . CARPAL TUNNEL RELEASE    . COLONOSCOPY    . ENDARTERECTOMY FEMORAL  07/05/2019   Procedure: Right Endarterectomy Femoral;  Surgeon: [name], MD;  Location: [facility] OR;  Service: Vascular;;  . EXCISION OF SKIN LESION     left arm  . LEFT HEART CATH AND CORONARY ANGIOGRAPHY N/A 06/27/2019   Procedure: LEFT HEART CATH AND CORONARY ANGIOGRAPHY;  Surgeon: Lyn Records, MD;  Location: [facility] INVASIVE CV LAB;  Service: Cardiovascular;  Laterality: N/A;  . TOE SURGERY     nails removed  . TONSILLECTOMY      Current Medications: Current Meds  Medication Sig  . albuterol (VENTOLIN HFA) 108 (90 Base) MCG/ACT inhaler Inhale into the lungs as needed for wheezing or shortness of breath.   Marland Kitchen alendronate (FOSAMAX) 70 MG tablet Take 70 mg by mouth every Wednesday. Take with a full glass of water on an empty stomach.   . Ascorbic Acid (VITAMIN C WITH ROSE HIPS) 500 MG tablet Take 500 mg by mouth 3 (three) times a week.  Marland Kitchen aspirin EC 81 MG tablet Take 81 mg by mouth daily.  . Calcium Carb-Cholecalciferol (CALCIUM 600/VITAMIN D3 PO) Take 1 tablet by mouth daily.  . carboxymethylcellulose (LUBRICANT EYE DROPS) 0.5 % SOLN Place 1 drop into both eyes 3 (three) times daily  as needed (dry/irritated eyes.).  Marland Kitchen carvedilol (COREG) 6.25 MG tablet Take 6.25 mg by mouth 2 (two) times daily with a meal.   . Cholecalciferol (VITAMIN D3) 50 MCG (2000 UT) TABS Take 2,000 Units by mouth every evening.  . clopidogrel (PLAVIX) 75 MG tablet Take 1 tablet (75 mg total) by mouth daily at 6 (six) AM.  . docusate sodium (COLACE) 100 MG capsule Take 100 mg by mouth 2 (two) times daily as needed for mild constipation.  . Fish Oil-Cholecalciferol (OMEGA-3 + VITAMIN D3 PO) Take 1 tablet by mouth daily at 12 noon.  . fluticasone (FLONASE) 50 MCG/ACT nasal spray Place 1 spray into both nostrils daily as needed for rhinitis (runny  nose/allergies.).  Marland Kitchen gabapentin (NEURONTIN) 300 MG capsule Take 300 mg by mouth 5 (five) times daily.   Marland Kitchen glucose blood test strip Accu-Chek Aviva Plus test strips  Use to check BS daily.  Marland Kitchen levothyroxine (SYNTHROID, LEVOTHROID) 125 MCG tablet Take 125 mcg by mouth daily before breakfast.  . lisinopril (PRINIVIL,ZESTRIL) 10 MG tablet Take 10 mg by mouth daily.  . magnesium hydroxide (MILK OF MAGNESIA) 400 MG/5ML suspension Take 15-30 mLs by mouth daily as needed for mild constipation.  . Multiple Vitamin (MULTIVITAMIN WITH MINERALS) TABS tablet Take 1 tablet by mouth 3 (three) times a week.  . Multiple Vitamins-Minerals (PRESERVISION AREDS 2 PO) Take 1 tablet by mouth in the morning and at bedtime.  . rosuvastatin (CRESTOR) 40 MG tablet Take 1 tablet (40 mg total) by mouth daily. (Patient taking differently: Take 40 mg by mouth every evening. )     Allergies:   Patient has no known allergies.   Social History   Socioeconomic History  . Marital status: Widowed    Spouse name: Not on file  . Number of children: Not on file  . Years of education: Not on file  . Highest education level: Not on file  Occupational History  . Not on file  Tobacco Use  . Smoking status: Former Smoker    Types: Cigarettes    Quit date: 03/30/2019    Years since quitting: 0.2  . Smokeless tobacco: Never Used  Substance and Sexual Activity  . Alcohol use: No  . Drug use: No  . Sexual activity: Not Currently  Other Topics Concern  . Not on file  Social History Narrative  . Not on file   Social Determinants of Health   Financial Resource Strain:   . Difficulty of Paying Living Expenses:   Food Insecurity:   . Worried About Charity fundraiser in the Last Year:   . Arboriculturist in the Last Year:   Transportation Needs:   . Film/video editor (Medical):   Marland Kitchen Lack of Transportation (Non-Medical):   Physical Activity:   . Days of Exercise per Week:   . Minutes of Exercise per Session:   Stress:    . Feeling of Stress :   Social Connections:   . Frequency of Communication with Friends and Family:   . Frequency of Social Gatherings with Friends and Family:   . Attends Religious Services:   . Active Member of Clubs or Organizations:   . Attends Archivist Meetings:   Marland Kitchen Marital Status:      Family History: The patient's family history includes Colon cancer in her sister; Diabetes in her brother; Heart disease in her brother and mother.  ROS:   Review of Systems  Constitution: Negative for decreased appetite, fever  and weight gain.  HENT: Negative for congestion, ear discharge, hoarse voice and sore throat.   Eyes: Negative for discharge, redness, vision loss in right eye and visual halos.  Cardiovascular: Negative for chest pain, dyspnea on exertion, leg swelling, orthopnea and palpitations.  Respiratory: Negative for cough, hemoptysis, shortness of breath and snoring.   Endocrine: Negative for heat intolerance and polyphagia.  Hematologic/Lymphatic: Negative for bleeding problem. Does not bruise/bleed easily.  Skin: Negative for flushing, nail changes, rash and suspicious lesions.  Musculoskeletal: Negative for arthritis, joint pain, muscle cramps, myalgias, neck pain and stiffness.  Gastrointestinal: Negative for abdominal pain, bowel incontinence, diarrhea and excessive appetite.  Genitourinary: Negative for decreased libido, genital sores and incomplete emptying.  Neurological: Negative for brief paralysis, focal weakness, headaches and loss of balance.  Psychiatric/Behavioral: Negative for altered mental status, depression and suicidal ideas.  Allergic/Immunologic: Negative for HIV exposure and persistent infections.    EKGs/Labs/Other Studies Reviewed:    The following studies were reviewed today:   EKG: None today  Left heart catheterization 06/27/2019  Heavy three-vessel coronary calcification.  Proximal LAD saccular aneurysm.  Moderate diffuse LAD  disease with 60 to 70% distal eccentric stenosis.  Ostial eccentric 50% circumflex followed by proximal eccentric 60 to 70% stenosis.  Moderate to severe diffuse right coronary disease, tortuosity, including 50% proximal stenosis and 60 to 70% mid to distal stenosis.  Normal LV systolic function with EF 65%.  EDP is normal.  RECOMMENDATIONS:  Aggressive risk factor modification.  In absence of symptoms, no indication for revascularization.  Consider intensification of beta-blocker therapy to help with reducing risk of coronary aneurysm expansion.  Going forward, consider intermittent coronary CT angiography to assess size of aneurysm if it was visible on the current study.  My prior experience suggest that FFR modeling allows an image that may be helpful.  It would also be helpful to look Abrol data from coronary study to see if the aneurysm can be measured.  Appears safe to proceed with abdominal aneurysm percutaneous repair  Recent Labs: 07/03/2019: ALT 19 07/05/2019: Magnesium 1.8 07/06/2019: BUN 21; Creatinine, Ser 0.92; Hemoglobin 9.1; Platelets 208; Potassium 4.2; Sodium 135  Recent Lipid Panel No results found for: CHOL, TRIG, HDL, CHOLHDL, VLDL, LDLCALC, LDLDIRECT  Physical Exam:    VS:  BP 138/64 (BP Location: Left Arm, Patient Position: Sitting, Cuff Size: Normal)   Pulse 78   Ht 5\' 6"  (1.676 m)   Wt 196 lb (88.9 kg)   SpO2 99%   BMI 31.64 kg/m     Wt Readings from Last 3 Encounters:  07/09/19 196 lb (88.9 kg)  07/05/19 199 lb 15.3 oz (90.7 kg)  07/03/19 200 lb 11.2 oz (91 kg)     GEN: Well nourished, well developed in no acute distress HEENT: Normal NECK: No JVD; No carotid bruits LYMPHATICS: No lymphadenopathy CARDIAC: S1S2 noted,RRR, no murmurs, rubs, gallops RESPIRATORY:  Clear to auscultation without rales, wheezing or rhonchi  ABDOMEN: Soft, non-tender, non-distended, +bowel sounds, no guarding. EXTREMITIES: No edema, No cyanosis, no  clubbing MUSCULOSKELETAL:  No deformity  SKIN: Warm and dry NEUROLOGIC:  Alert and oriented x 3, non-focal PSYCHIATRIC:  Normal affect, good insight  ASSESSMENT:    1. Coronary artery disease involving native coronary artery of native heart without angina pectoris   2. AAA (abdominal aortic aneurysm) without rupture (HCC)   3. Benign hypertension   4. Mixed hyperlipidemia   5. Prediabetes   6. Obesity (BMI 30-39.9)   7. Coronary artery aneurysm  PLAN:    1.  She appears to be doing well clinically. In terms of her CAD continue patient on aspirin 81 mg daily, Plavix 75 mg daily, Crestor 40 mg daily.  2. AAA-  She is status post EVAR and appears to be doing well.  Continue her dual antiplatelet therapy.  She follows with vascular in a month she tells me.  3. Hypertension- Her blood pressure is acceptable in the office today.  4. Hyperlipidemia - continue patient on her statin medication.  5. Prediabetes - continue current medication regimen.  The patient is in agreement with the above plan. The patient left the office in stable condition.  The patient will follow up in in 3 months with Dr. Dulce Sellar.    Medication Adjustments/Labs and Tests Ordered: Current medicines are reviewed at length with the patient today.  Concerns regarding medicines are outlined above.  No orders of the defined types were placed in this encounter.  No orders of the defined types were placed in this encounter.   Patient Instructions  Medication Instructions:   Your physician recommends that you continue on your current medications as directed. Please refer to the Current Medication list given to you today.  *If you need a refill on your cardiac medications before your next appointment, please call your pharmacy*   Lab Work: NONE ORDERED  TODAY   If you have labs (blood work) drawn today and your tests are completely normal, you will receive your results only by: Marland Kitchen MyChart Message (if you have  MyChart) OR . A paper copy in the mail If you have any lab test that is abnormal or we need to change your treatment, we will call you to review the results.   Testing/Procedures: NONE ORDERED  TODAY   Follow-Up: At [facility], you and your health needs are our priority.  As part of our continuing mission to provide you with exceptional heart care, we have created designated Provider Care Teams.  These Care Teams include your primary Cardiologist (physician) and Advanced Practice Providers (APPs -  Physician Assistants and Nurse Practitioners) who all work together to provide you with the care you need, when you need it.  We recommend signing up for the patient portal called "MyChart".  Sign up information is provided on this After Visit Summary.  MyChart is used to connect with patients for Virtual Visits (Telemedicine).  Patients are able to view lab/test results, encounter notes, upcoming appointments, etc.  Non-urgent messages can be sent to your provider as well.   To learn more about what you can do with MyChart, go to [email or web address].    Your next appointment:   3 month(s)  The format for your next appointment:   In Person  Provider:   You will see Norman Herrlich, MD.  Or, you can be scheduled with the following Advanced Practice Provider on your designated Care Team (at our [facility]):  Gillian Shields, FNP     Other Instructions      Adopting a Healthy Lifestyle.  Know what a healthy weight is for you (roughly BMI <25) and aim to maintain this   Aim for 7+ servings of fruits and vegetables daily   65-80+ fluid ounces of water or unsweet tea for healthy kidneys   Limit to max 1 drink of alcohol per day; avoid smoking/tobacco   Limit animal fats in diet for cholesterol and heart health - choose grass fed whenever available   Avoid highly processed foods, and foods  high in saturated/trans fats   Aim for low stress - take time to unwind and care for  your mental health   Aim for 150 min of moderate intensity exercise weekly for heart health, and weights twice weekly for bone health   Aim for 7-9 hours of sleep daily   When it comes to diets, agreement about the perfect plan isnt easy to find, even among the experts. Experts at the [employer] developed an idea known as the Healthy Eating Plate. Just imagine a plate divided into logical, healthy portions.   The emphasis is on diet quality:   Load up on vegetables and fruits - one-half of your plate: Aim for color and variety, and remember that potatoes dont count.   Go for whole grains - one-quarter of your plate: Whole wheat, barley, wheat berries, quinoa, oats, brown rice, and foods made with them. If you want pasta, go with whole wheat pasta.   Protein power - one-quarter of your plate: Fish, chicken, beans, and nuts are all healthy, versatile protein sources. Limit red meat.   The diet, however, does go beyond the plate, offering a few other suggestions.   Use healthy plant oils, such as olive, canola, soy, corn, sunflower and peanut. Check the labels, and avoid partially hydrogenated oil, which have unhealthy trans fats.   If youre thirsty, drink water. Coffee and tea are good in moderation, but skip sugary drinks and limit milk and dairy products to one or two daily servings.   The type of carbohydrate in the diet is more important than the amount. Some sources of carbohydrates, such as vegetables, fruits, whole grains, and beans-are healthier than others.   Finally, stay active  Signed, Thomasene RippleKardie Skyra Crichlow, DO  07/09/2019 2:17 PM    [facility]

## 2019-07-09 NOTE — Patient Instructions (Signed)
Medication Instructions:   Your physician recommends that you continue on your current medications as directed. Please refer to the Current Medication list given to you today.  *If you need a refill on your cardiac medications before your next appointment, please call your pharmacy*   Lab Work: NONE ORDERED  TODAY   If you have labs (blood work) drawn today and your tests are completely normal, you will receive your results only by: Marland Kitchen MyChart Message (if you have MyChart) OR . A paper copy in the mail If you have any lab test that is abnormal or we need to change your treatment, we will call you to review the results.   Testing/Procedures: NONE ORDERED  TODAY   Follow-Up: At [facility], you and your health needs are our priority.  As part of our continuing mission to provide you with exceptional heart care, we have created designated Provider Care Teams.  These Care Teams include your primary Cardiologist (physician) and Advanced Practice Providers (APPs -  Physician Assistants and Nurse Practitioners) who all work together to provide you with the care you need, when you need it.  We recommend signing up for the patient portal called "MyChart".  Sign up information is provided on this After Visit Summary.  MyChart is used to connect with patients for Virtual Visits (Telemedicine).  Patients are able to view lab/test results, encounter notes, upcoming appointments, etc.  Non-urgent messages can be sent to your provider as well.   To learn more about what you can do with MyChart, go to [email or web address].    Your next appointment:   3 month(s)  The format for your next appointment:   In Person  Provider:   You will see Norman Herrlich, MD.  Or, you can be scheduled with the following Advanced Practice Provider on your designated Care Team (at our [facility]):  Gillian Shields, FNP     Other Instructions

## 2019-07-10 ENCOUNTER — Other Ambulatory Visit: Payer: Self-pay

## 2019-07-10 DIAGNOSIS — I714 Abdominal aortic aneurysm, without rupture, unspecified: Secondary | ICD-10-CM

## 2019-07-10 NOTE — Discharge Summary (Signed)
[facility] Discharge Summary   Cathy Haney 03/29/1951 69 y.o. female  MRN: [id number]  Admission Date: 07/05/2019  Discharge Date: 07/07/19  Physician: Dr. Randie Heinz  Admission Diagnosis: AAA (abdominal aortic aneurysm) [facility]) [I71.4]  Discharge Day services:    see progress note 07/07/19  Hospital Course:  The patient was admitted to the hospital and taken to the operating room on 07/05/2019 and underwent: Endovascular repair of abdominal aortic aneurysm and iliofemoral endarterectomy with vein patch angioplasty right leg by Dr. Randie Heinz.  She tolerated the procedure well and was admitted to the hospital postoperatively.  POD #1 patient had trouble with nausea and trouble increasing mobility.  POD #2 patient was ready for discharge home.  At the time of discharge both feet both warm and well-perfused.  She will follow-up in office with Dr. Randie Heinz in about 3 to 4 weeks with a CTA abdomen/pelvis.  She will be prescribed 2 to 3 days of narcotic pain medication for continued postoperative pain control.  She will be discharged in stable condition.   CBC    Component Value Date/Time   WBC 14.0 (H) 07/06/2019 0434   RBC 2.96 (L) 07/06/2019 0434   HGB 9.1 (L) 07/06/2019 0434   HGB 12.1 06/22/2019 0942   HCT 27.5 (L) 07/06/2019 0434   HCT 36.5 06/22/2019 0942   PLT 208 07/06/2019 0434   PLT 227 06/22/2019 0942   MCV 92.9 07/06/2019 0434   MCV 94 06/22/2019 0942   MCH 30.7 07/06/2019 0434   MCHC 33.1 07/06/2019 0434   RDW 13.3 07/06/2019 0434   RDW 13.2 06/22/2019 0942    BMET    Component Value Date/Time   NA 135 07/06/2019 0434   NA 137 06/22/2019 0942   K 4.2 07/06/2019 0434   CL 103 07/06/2019 0434   CO2 21 (L) 07/06/2019 0434   GLUCOSE 164 (H) 07/06/2019 0434   BUN 21 07/06/2019 0434   BUN 13 06/22/2019 0942   CREATININE 0.92 07/06/2019 0434   CALCIUM 8.9 07/06/2019 0434   GFRNONAA >60 07/06/2019 0434   GFRAA >60 07/06/2019 0434         Discharge Diagnosis:  AAA (abdominal  aortic aneurysm) (HCC) [I71.4]  Secondary Diagnosis: Patient Active Problem List   Diagnosis Date Noted  . Acquired hypothyroidism 07/06/2019  . CAD (coronary artery disease) 06/27/2019  . Coronary artery calcification seen on CT scan   . Shortness of breath 06/19/2019  . Mixed hyperlipidemia 06/19/2019  . Prediabetes 06/19/2019  . Obesity (BMI 30-39.9) 06/19/2019  . AAA (abdominal aortic aneurysm) without rupture (HCC) 06/19/2019  . Preoperative cardiovascular examination 06/01/2017  . Diverticulitis 05/30/2017  . Thyroid disease 05/30/2017  . Benign hypertension 05/30/2017  . Aortic aneurysm of unspecified site, without rupture (HCC) 05/30/2017  . Abdominal aortic aneurysm (HCC) 05/27/2017   Past Medical History:  Diagnosis Date  . AAA (abdominal aortic aneurysm) without rupture (HCC) 06/19/2019  . Abdominal aortic aneurysm (HCC) 05/27/2017  . Acquired hypothyroidism 07/06/2019  . Allergy   . Anemia    during pregnancy  . Anxiety    hx  . Aortic aneurysm of unspecified site, without rupture (HCC) 05/30/2017  . Arthritis   . Benign essential hypertension 05/30/2017  . Benign hypertension 05/30/2017  . CAD (coronary artery disease) 06/27/2019  . Coronary artery calcification seen on CT scan   . Depression    hx has meds as needed  . Diverticulitis   . Dyspnea   . Hypothyroidism    Aquired  . Mixed  hyperlipidemia   . Obesity (BMI 30-39.9) 06/19/2019  . Pneumonia    in the past  . Pre-diabetes    diet controlled, doesnt eat sweets  . Prediabetes   . Preoperative cardiovascular examination 06/01/2017  . Shortness of breath 06/19/2019  . Thyroid disease      Allergies as of 07/07/2019   No Known Allergies     Medication List    TAKE these medications   alendronate 70 MG tablet Commonly known as: FOSAMAX Take 70 mg by mouth every Wednesday. Take with a full glass of water on an empty stomach.   aspirin EC 81 MG tablet Take 81 mg by mouth daily.   CALCIUM 600/VITAMIN D3  PO Take 1 tablet by mouth daily.   carvedilol 6.25 MG tablet Commonly known as: COREG Take 6.25 mg by mouth 2 (two) times daily with a meal.   clopidogrel 75 MG tablet Commonly known as: PLAVIX Take 1 tablet (75 mg total) by mouth daily at 6 (six) AM.   docusate sodium 100 MG capsule Commonly known as: COLACE Take 100 mg by mouth 2 (two) times daily as needed for mild constipation.   fluticasone 50 MCG/ACT nasal spray Commonly known as: FLONASE Place 1 spray into both nostrils daily as needed for rhinitis (runny nose/allergies.).   gabapentin 300 MG capsule Commonly known as: NEURONTIN Take 300 mg by mouth 5 (five) times daily.   glucose blood test strip Accu-Chek Aviva Plus test strips  Use to check BS daily.   levothyroxine 125 MCG tablet Commonly known as: SYNTHROID Take 125 mcg by mouth daily before breakfast.   lisinopril 10 MG tablet Commonly known as: ZESTRIL Take 10 mg by mouth daily.   Lubricant Eye Drops 0.5 % Soln Generic drug: carboxymethylcellulose Place 1 drop into both eyes 3 (three) times daily as needed (dry/irritated eyes.).   magnesium hydroxide 400 MG/5ML suspension Commonly known as: MILK OF MAGNESIA Take 15-30 mLs by mouth daily as needed for mild constipation.   multivitamin with minerals Tabs tablet Take 1 tablet by mouth 3 (three) times a week.   OMEGA-3 + VITAMIN D3 PO Take 1 tablet by mouth daily at 12 noon.   PRESERVISION AREDS 2 PO Take 1 tablet by mouth in the morning and at bedtime.   rosuvastatin 40 MG tablet Commonly known as: CRESTOR Take 1 tablet (40 mg total) by mouth daily. What changed: when to take this   Ventolin HFA 108 (90 Base) MCG/ACT inhaler Generic drug: albuterol Inhale into the lungs as needed for wheezing or shortness of breath.   vitamin C with rose hips 500 MG tablet Take 500 mg by mouth 3 (three) times a week.   Vitamin D3 50 MCG (2000 UT) Tabs Take 2,000 Units by mouth every evening.        Discharge Instructions:   [facility]  Discharge Instructions Endovascular Aortic Aneurysm Repair  Please refer to the following instructions for your post-procedure care. Your surgeon or Physician Assistant will discuss any changes with you.  Activity  You are encouraged to walk as much as you can. You can slowly return to normal activities but must avoid strenuous activity and heavy lifting until your doctor tells you it's OK. Avoid activities such as vacuuming or swinging a gold club. It is normal to feel tired for several weeks after your surgery. Do not drive until your doctor gives the OK and you are no longer taking prescription pain medications. It is also  normal to have difficulty with sleep habits, eating, and bowel movements after surgery. These will go away with time.  Bathing/Showering  You may shower after you go home. If you have an incision, do not soak in a bathtub, hot tub, or swim until the incision heals completely.  Incision Care  Shower every day. Clean your incision with mild soap and water. Pat the area dry with a clean towel. You do not need a bandage unless otherwise instructed. Do not apply any ointments or creams to your incision. If you clothing is irritating, you may cover your incision with a dry gauze pad.  Diet  Resume your normal diet. There are no special food restrictions following this procedure. A low fat/low cholesterol diet is recommended for all patients with vascular disease. In order to heal from your surgery, it is CRITICAL to get adequate nutrition. Your body requires vitamins, minerals, and protein. Vegetables are the best source of vitamins and minerals. Vegetables also provide the perfect balance of protein. Processed food has little nutritional value, so try to avoid this.  Medications  Resume taking all of your medications unless your doctor or Physician Assistnat tells you not to. If your incision is  causing pain, you may take over-the-counter pain relievers such as acetaminophen (Tylenol). If you were prescribed a stronger pain medication, please be aware these medications can cause nausea and constipation. Prevent nausea by taking the medication with a snack or meal. Avoid constipation by drinking plenty of fluids and eating foods with a high amount of fiber, such as fruits, vegetables, and grains. Do not take Tylenol if you are taking prescription pain medications.   Follow up  Our office will schedule a follow-up appointment with a C.T. scan 3-4 weeks after your surgery.  Please call us immediately for any of the following conditions  . Severe or worsening pain in your legs or feet or in your abdomen back or chest. . Increased pain, redness, drainage (pus) from your incision sit. . Increased abdominal pain, bloating, nausea, vomiting or persistent diarrhea. . Fever of 101 degrees or higher. . Swelling in your leg (s), .  Reduce your risk of vascular disease  .Stop smoking. If you would like help call [facility] at 1-800-QUIT-NOW (807-743-8678) or [place] at 7154109478. .Manage your cholesterol .Maintain a desired weight .Control your diabetes .Keep your blood pressure down  If you have questions, please call the office at [phone].   Disposition: Home  Patient's condition: is Good  Follow up: 1. Dr. Randie Heinz in 3-4 weeks with CTA protocol   Emilie Rutter, PA-C [facility] [phone] 07/10/2019  12:45 PM   - For VQI Registry use - Post-op:  Time to Extubation: [x]  In OR, [ ]  < 12 hrs, [ ]  12-24 hrs, [ ]  >=24 hrs Vasopressors Req. Post-op: No MI: No., [ ]  Troponin only, [ ]  EKG or Clinical New Arrhythmia: No CHF: No ICU Stay: 0 days Transfusion: No   Complications: Resp failure: No., [ ]  Pneumonia, [ ]  Ventilator Chg in renal function: No., [ ]  Inc. Cr > 0.5, [ ]  Temp. Dialysis,  [ ]  Permanent dialysis Leg ischemia: No., no  Surgery needed, [ ]  Yes, Surgery needed,  [ ]  Amputation Bowel ischemia: No., [ ]  Medical Rx, [ ]  Surgical Rx Wound complication: No., [ ]  Superficial separation/infection, [ ]  Return to OR Return to OR: No  Return to OR for bleeding: No Stroke: No., [ ]  Minor, [ ]  Major  Discharge medications:  Statin use:  Yes  ASA use:  Yes  Plavix use:  Yes  Beta blocker use:  Yes  ARB use:  No ACEI use:  Yes CCB use:  No

## 2019-07-16 ENCOUNTER — Telehealth: Payer: Self-pay | Admitting: Vascular Surgery

## 2019-07-16 NOTE — Telephone Encounter (Signed)
Pt called stating she had some swelling in her right groin about the shape of a 2 inch long pencil.  No redness or drainage no real pain.  Told her to observe.  If increasing in size she will call us back  Fabienne Bruns, MD [facility] [place] Office: [phone]

## 2019-07-27 ENCOUNTER — Other Ambulatory Visit: Payer: Self-pay

## 2019-07-27 ENCOUNTER — Ambulatory Visit
Admission: RE | Admit: 2019-07-27 | Discharge: 2019-07-27 | Disposition: A | Discharge status: Home / Self Care | Payer: Medicare Other | Source: Ambulatory Visit | Attending: Vascular Surgery | Admitting: Vascular Surgery

## 2019-07-27 DIAGNOSIS — I714 Abdominal aortic aneurysm, without rupture, unspecified: Secondary | ICD-10-CM

## 2019-07-27 MED ORDER — IOPAMIDOL (ISOVUE-370) INJECTION 76%
75.0000 mL | Freq: Once | INTRAVENOUS | Status: AC | PRN
  Administered 2019-07-27: 75 mL via INTRAVENOUS

## 2019-08-02 ENCOUNTER — Telehealth (HOSPITAL_COMMUNITY): Payer: Self-pay

## 2019-08-02 NOTE — Telephone Encounter (Signed)

## 2019-08-03 ENCOUNTER — Ambulatory Visit (INDEPENDENT_AMBULATORY_CARE_PROVIDER_SITE_OTHER): Payer: Self-pay | Admitting: Vascular Surgery

## 2019-08-03 ENCOUNTER — Encounter: Payer: Self-pay | Admitting: Vascular Surgery

## 2019-08-03 ENCOUNTER — Other Ambulatory Visit: Payer: Self-pay

## 2019-08-03 VITALS — BP 155/86 | HR 78 | Temp 97.7°F | Resp 20 | Ht 66.0 in | Wt 201.0 lb

## 2019-08-03 DIAGNOSIS — I714 Abdominal aortic aneurysm, without rupture, unspecified: Secondary | ICD-10-CM

## 2019-08-03 NOTE — Progress Notes (Signed)
    Subjective:     Patient ID: Cathy Haney, female   DOB: 01-Oct-1950, 69 y.o.   MRN: [id number]  HPI 69 year old female follows up after endovascular aneurysm repair.  She did have some swelling in her right groin this is now resolved.  Overall she has no complaints is recovering well.   Review of Systems Right groin swelling resolved    Objective:   Physical Exam  Vitals:   08/03/19 1459  BP: (!) 155/86  Pulse: 78  Resp: 20  Temp: 97.7 F (36.5 C)  SpO2: 97%   Awake alert oriented Nonlabored respirations Abdomen is soft nontender Bilateral common femoral arteries are are palpable  CT IMPRESSION: Interval endovascular repair of infrarenal abdominal aortic aneurysm with EVAR. No complicating features, with interval positive remodeling of the aneurysm sac.  There is a small hypodense/hypoenhancing wedge-shaped region of the posterior left kidney, likely a small infarct.  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 Associated aortic atherosclerosis. Aortic Atherosclerosis (ICD10-I70.0).  We reviewed her CT scan together.    Assessment/plan     69 year old female status post endovascular aneurysm repair.  She is recovering well.  We will follow her up in 1 year with duplex of her endovascular aneurysm repair.      Archer Moist C. Randie Heinz, MD [facility] [place] Office: [phone] Pager: [phone]

## 2019-08-06 ENCOUNTER — Other Ambulatory Visit: Payer: Self-pay | Admitting: *Deleted

## 2019-08-06 DIAGNOSIS — I714 Abdominal aortic aneurysm, without rupture, unspecified: Secondary | ICD-10-CM

## 2019-09-19 ENCOUNTER — Ambulatory Visit: Payer: Medicare Other | Admitting: Cardiology

## 2019-09-21 ENCOUNTER — Other Ambulatory Visit: Payer: Self-pay

## 2019-10-08 ENCOUNTER — Ambulatory Visit: Payer: Medicare Other | Admitting: Cardiology

## 2020-01-01 ENCOUNTER — Other Ambulatory Visit: Payer: Self-pay | Admitting: Physician Assistant

## 2020-04-07 IMAGING — CT CT CTA ABD/PEL W/CM AND/OR W/O CM
2 of 7 series · 15 of 46 positions shown, 17 images · IV contrast (iopamidol)
Comparison: 06/17/2017

CLINICAL DATA: Follow up to AAA first noted on MRI of Lumbar Prev
Back Sx, Skin Cancer with Surgery HTN Prev In PACS

EXAM:
CTA ABDOMEN AND PELVIS WITH CONTRAST
TECHNIQUE: Multidetector CT imaging of the abdomen and pelvis was performed
using the standard protocol during bolus administration of
intravenous contrast. Multiplanar reconstructed images and MIPs were
obtained and reviewed to evaluate the vascular anatomy.
CONTRAST:  75mL [id number] IOPAMIDOL ([id number]) INJECTION 76%
Creatinine was obtained on site at [HOSPITAL] at [REDACTED].
Results: Creatinine 0.8 mg/dL.  BUN 11.  GFR 76.

[Series 5: cta arterial 2.00 bv36 s3 axial st · axial · arterial · 0.78mm/px · z∈[+1029,+1451]mm · 12 of 233 slices shown, 14 images]
[im 11/233  soft-tissue]
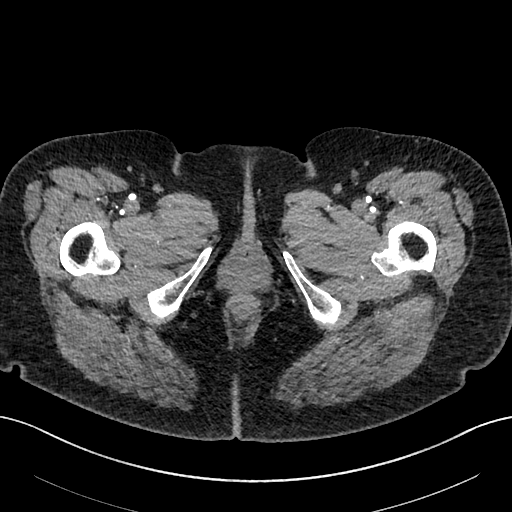
[im 11/233  bone]
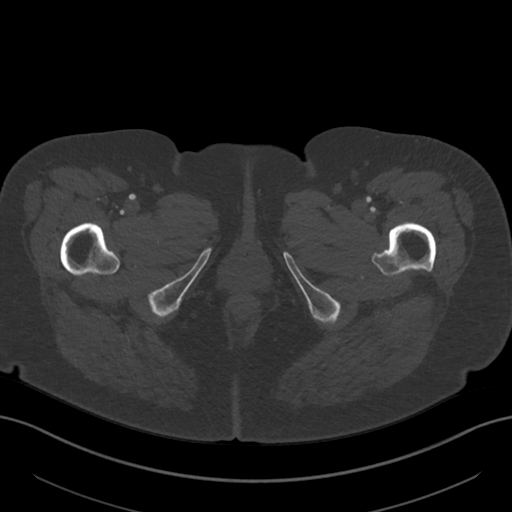
[im 32/233  soft-tissue]
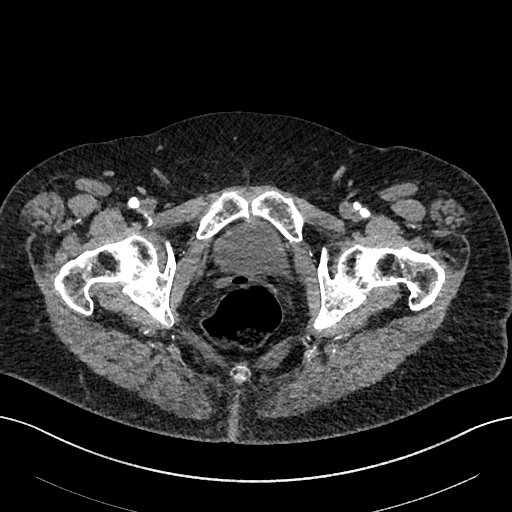
[im 53/233  soft-tissue]
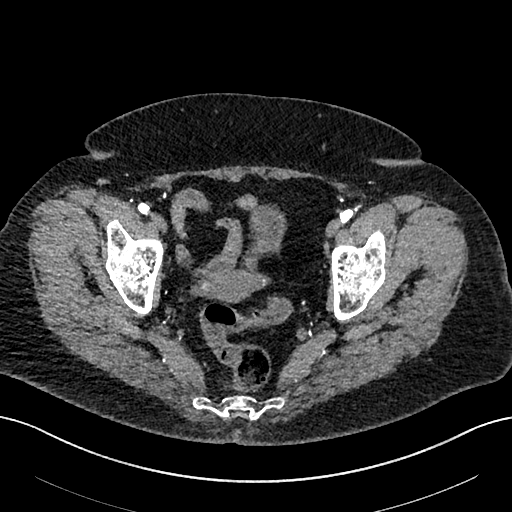
[im 74/233  soft-tissue]
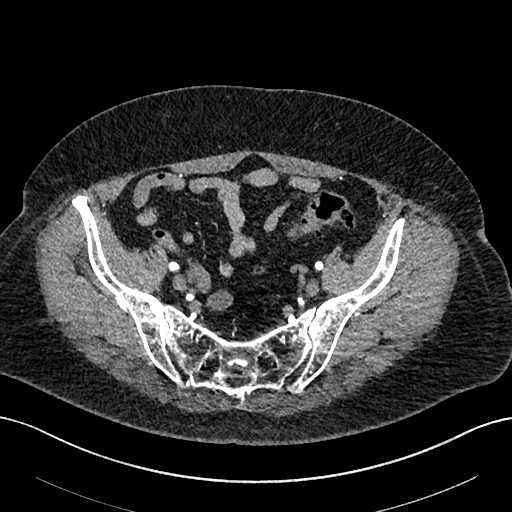
[im 85/233  soft-tissue]
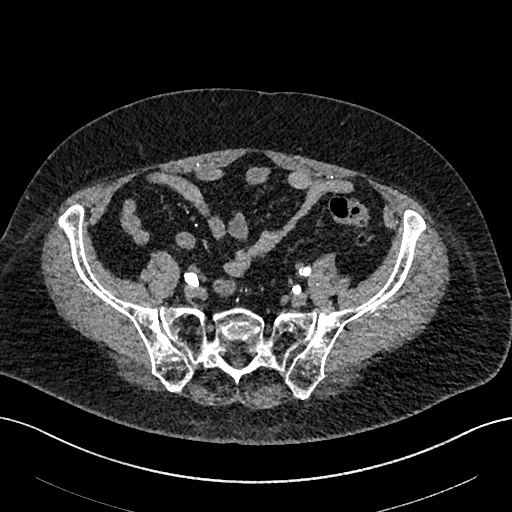
[im 106/233  soft-tissue]
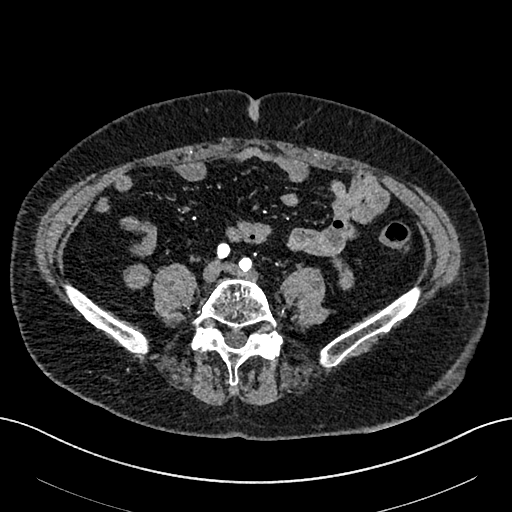
[im 127/233  soft-tissue]
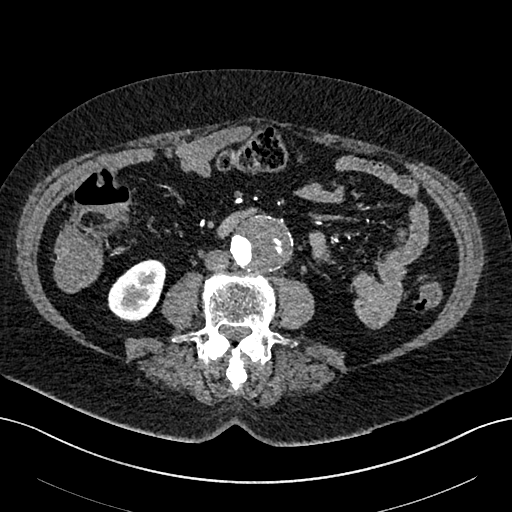
[im 148/233  soft-tissue]
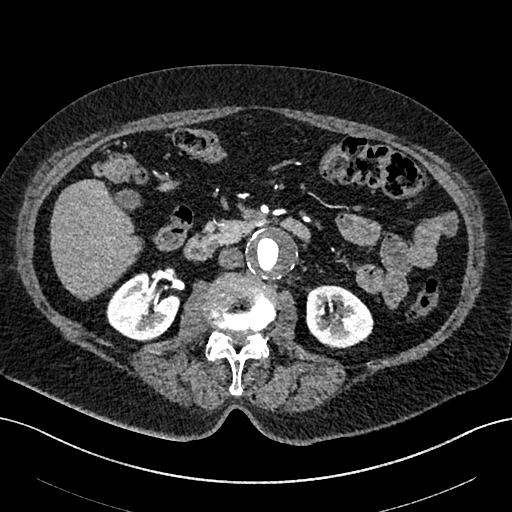
[im 159/233  soft-tissue]
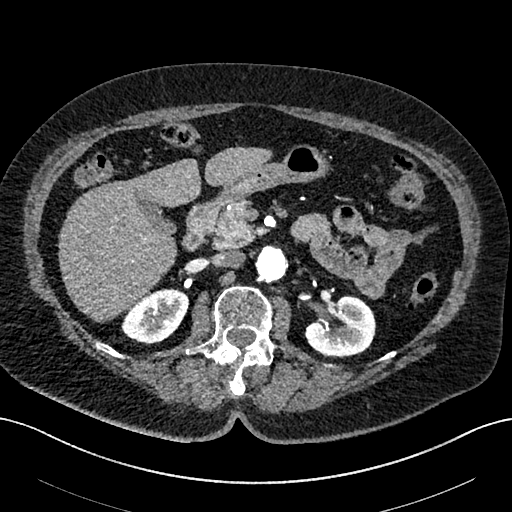
[im 159/233  bone]
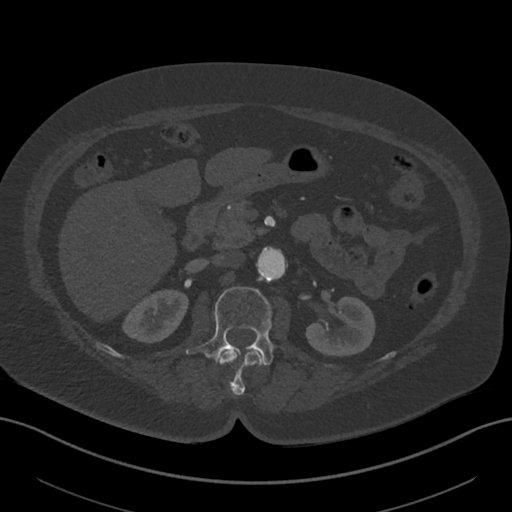
[im 180/233  soft-tissue]
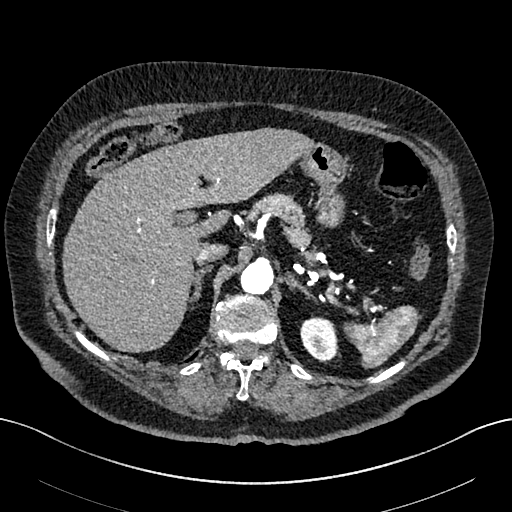
[im 201/233  soft-tissue]
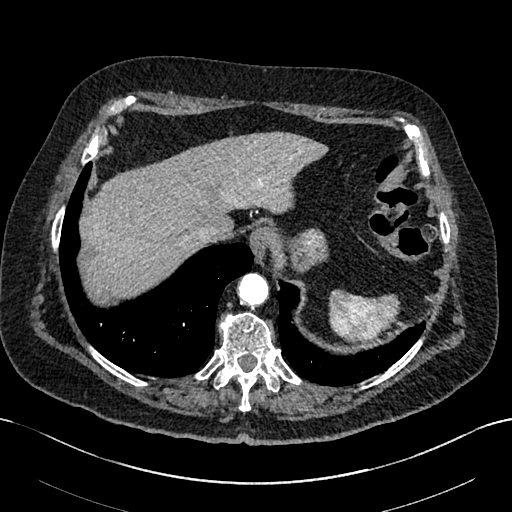
[im 222/233  soft-tissue]
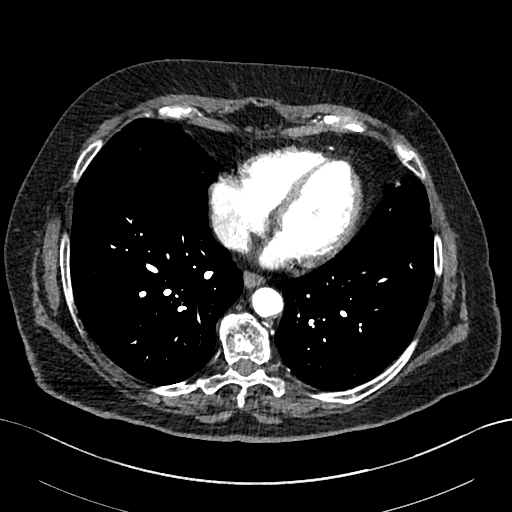

[Series 8: cta arterial 2.00 bv36 s3 cor cor art st · coronal · arterial · 0.73mm/px · 3 of 145 slices shown]
[im 37/145  soft-tissue]
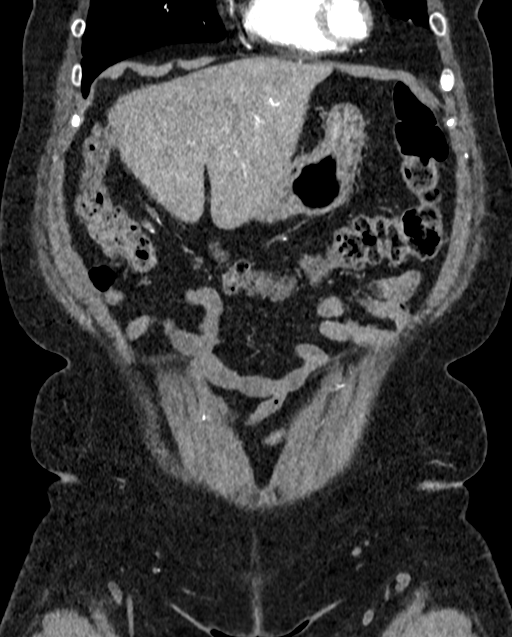
[im 73/145  soft-tissue]
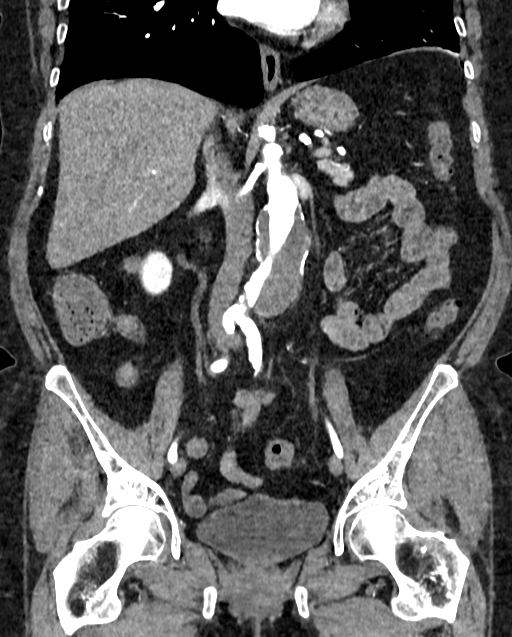
[im 109/145  soft-tissue]
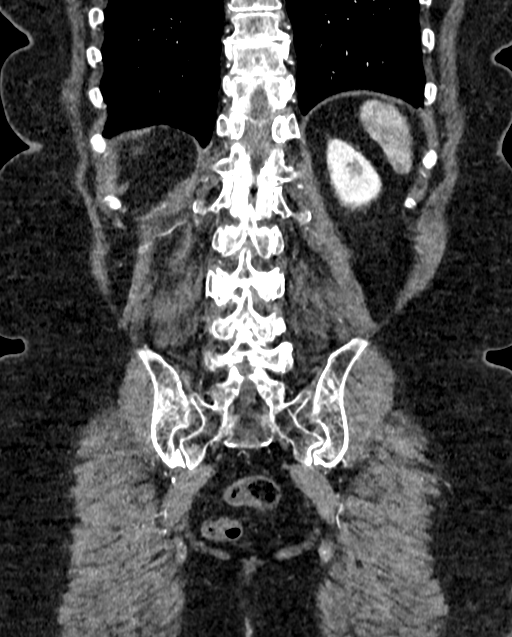

[15 of 46 positions shown; findings below may reference images not displayed]

FINDINGS: VASCULAR

Aorta: Moderate partially calcified atheromatous plaque in the
visualized distal descending thoracic, suprarenal and juxtarenal
segments. Fusiform infrarenal aneurysm, 4.8 x 4.5 cm maximum
transverse dimensions, previously 4.7 cm. There is moderate
nonocclusive mural thrombus in the aneurysmal segment. Aorta tapers
to 1.6 cm diameter just above the bifurcation. No dissection or
stenosis.

Celiac: Partially calcified ostial plaque resulting in short segment
stenosis of possible hemodynamic significance, patent distally.

SMA: Scattered eccentric calcified nonocclusive plaque. Patent
distally.

Renals: Single right, widely patent.

Single left, with proximal bifurcation into anterior and posterior
divisions, both patent.

IMA: Patent, arising from the aneurysmal segment.

Inflow: Mild tortuosity of the iliac arterial systems. Scattered
calcified plaque. No aneurysm, dissection, or stenosis.

Proximal Outflow: Mild eccentric calcified plaque. No aneurysm or
stenosis.

Veins: Patent hepatic veins, portal vein, SMV, IMV, splenic vein,
bilateral renal veins, IVC. No venous pathology identified.

Review of the MIP images confirms the above findings.

NON-VASCULAR

Lower chest: Lung bases clear.  No pleural or pericardial effusion.

Hepatobiliary: No focal liver abnormality is seen. No gallstones,
gallbladder wall thickening, or biliary dilatation.

Pancreas: Unremarkable. No pancreatic ductal dilatation or
surrounding inflammatory changes.

Spleen: Normal in size without focal abnormality.

Adrenals/Urinary Tract: Adrenal glands are unremarkable. Kidneys are
normal, without renal calculi, focal lesion, or hydronephrosis.
Bladder is unremarkable.

Stomach/Bowel: Stomach and small bowel nondilated. Lipomatous
ileocecal valve. Appendix not discretely identified. No pericecal
inflammatory/edematous change. The colon is nondilated with
scattered diverticula most numerous in the distal descending and
sigmoid portion, without adjacent inflammatory/edematous change or
abscess.

Lymphatic: No abdominal or pelvic adenopathy.

Reproductive: Uterus and bilateral adnexa are unremarkable.

Other: No ascites.  No free air.

Musculoskeletal: Chronic compression deformities of T12, L1, and L3.
Spondylitic changes in the lower thoracic and lumbar spine. No new
fracture or worrisome bone lesion.
IMPRESSION: VASCULAR

1. 4.8 cm infrarenal abdominal aortic aneurysm, previously 4.7 cm.
2. Mildly tortuous iliac arterial systems with scattered
atheromatous change, no high-grade stenosis or aneurysm.

NON-VASCULAR

1. No acute findings.
2. Colonic diverticulosis.
3. Chronic lumbar spondylitic change and compression deformities.

## 2020-05-28 DIAGNOSIS — M1990 Unspecified osteoarthritis, unspecified site: Secondary | ICD-10-CM | POA: Insufficient documentation

## 2020-05-28 DIAGNOSIS — E039 Hypothyroidism, unspecified: Secondary | ICD-10-CM | POA: Insufficient documentation

## 2020-05-28 DIAGNOSIS — D649 Anemia, unspecified: Secondary | ICD-10-CM | POA: Insufficient documentation

## 2020-05-28 DIAGNOSIS — R0600 Dyspnea, unspecified: Secondary | ICD-10-CM | POA: Insufficient documentation

## 2020-05-28 DIAGNOSIS — F32A Depression, unspecified: Secondary | ICD-10-CM | POA: Insufficient documentation

## 2020-05-28 DIAGNOSIS — T7840XA Allergy, unspecified, initial encounter: Secondary | ICD-10-CM | POA: Insufficient documentation

## 2020-05-28 DIAGNOSIS — R7303 Prediabetes: Secondary | ICD-10-CM | POA: Insufficient documentation

## 2020-05-28 DIAGNOSIS — J189 Pneumonia, unspecified organism: Secondary | ICD-10-CM | POA: Insufficient documentation

## 2020-05-28 DIAGNOSIS — F419 Anxiety disorder, unspecified: Secondary | ICD-10-CM | POA: Insufficient documentation

## 2020-05-29 ENCOUNTER — Ambulatory Visit (INDEPENDENT_AMBULATORY_CARE_PROVIDER_SITE_OTHER): Payer: Medicare Other | Admitting: Cardiology

## 2020-05-29 ENCOUNTER — Encounter: Payer: Self-pay | Admitting: Cardiology

## 2020-05-29 ENCOUNTER — Other Ambulatory Visit: Payer: Self-pay

## 2020-05-29 VITALS — BP 128/78 | HR 88 | Ht 66.0 in | Wt 213.2 lb

## 2020-05-29 DIAGNOSIS — E782 Mixed hyperlipidemia: Secondary | ICD-10-CM | POA: Diagnosis not present

## 2020-05-29 DIAGNOSIS — I2510 Atherosclerotic heart disease of native coronary artery without angina pectoris: Secondary | ICD-10-CM

## 2020-05-29 DIAGNOSIS — R7303 Prediabetes: Secondary | ICD-10-CM | POA: Diagnosis not present

## 2020-05-29 DIAGNOSIS — I714 Abdominal aortic aneurysm, without rupture, unspecified: Secondary | ICD-10-CM

## 2020-05-29 DIAGNOSIS — E669 Obesity, unspecified: Secondary | ICD-10-CM

## 2020-05-29 NOTE — Progress Notes (Signed)
Cardiology Office Note:    Date:  05/29/2020   ID:  Cathy Haney, DOB [birth year], MRN [id number]  PCP:  Maris Berger, MD  Cardiologist:  Norman Herrlich, MD  Electrophysiologist:  None   Referring MD: Maris Berger, MD   " I am doing well"  History of Present Illness:    Cathy Haney is a 70 y.o. female with a hx of coronary artery disease recent left heart cath which also showed saccular LAD proximal aneurysm, abdominal   aortic aneurysm status post EVAR, on July 05, 2019, hypertension, hyperlipidemia, hypothyroidism presents for follow-up visit.  Last saw the patient July 09, 2019 at that time she appears to be doing well from a cardiovascular standpoint.  She is recovering well from her procedure.  Today she tells me that she feels good but she does have low back pain which causes her limitations.  She has been approached about getting injections to help relieve the pain but for now she has declined.  Past Medical History:  Diagnosis Date  . AAA (abdominal aortic aneurysm) without rupture (HCC) 06/19/2019  . Abdominal aortic aneurysm (HCC) 05/27/2017  . Acquired hypothyroidism 07/06/2019  . Allergy   . Anemia    during pregnancy  . Anxiety    hx  . Aortic aneurysm of unspecified site, without rupture (HCC) 05/30/2017  . Arthritis   . Benign essential hypertension 05/30/2017  . Benign hypertension 05/30/2017  . CAD (coronary artery disease) 06/27/2019  . Coronary artery calcification seen on CT scan   . Depression    hx has meds as needed  . Diverticulitis   . Dyspnea   . Hypothyroidism    Aquired  . Mixed hyperlipidemia   . Obesity (BMI 30-39.9) 06/19/2019  . Pneumonia    in the past  . Pre-diabetes    diet controlled, doesnt eat sweets  . Prediabetes   . Preoperative cardiovascular examination 06/01/2017  . Shortness of breath 06/19/2019  . Thyroid disease     Past Surgical History:  Procedure Laterality Date  . ABDOMINAL AORTA STENT  07/05/2019   ABDOMINAL AORTIC  ENDOVASCULAR STENT GRAFT (N/A Groin)   . ABDOMINAL AORTIC ENDOVASCULAR STENT GRAFT N/A 07/05/2019   Procedure: ABDOMINAL AORTIC ENDOVASCULAR STENT GRAFT;  Surgeon: [name], MD;  Location: [facility] OR;  Service: Vascular;  Laterality: N/A;  . BACK SURGERY     khyphoplasty  . CARPAL TUNNEL RELEASE    . COLONOSCOPY    . ENDARTERECTOMY FEMORAL  07/05/2019   Procedure: Right Endarterectomy Femoral;  Surgeon: [name], MD;  Location: [facility] OR;  Service: Vascular;;  . EXCISION OF SKIN LESION     left arm  . LEFT HEART CATH AND CORONARY ANGIOGRAPHY N/A 06/27/2019   Procedure: LEFT HEART CATH AND CORONARY ANGIOGRAPHY;  Surgeon: Lyn Records, MD;  Location: [facility] INVASIVE CV LAB;  Service: Cardiovascular;  Laterality: N/A;  . TOE SURGERY     nails removed  . TONSILLECTOMY      Current Medications: Current Meds  Medication Sig  . albuterol (VENTOLIN HFA) 108 (90 Base) MCG/ACT inhaler Inhale into the lungs as needed for wheezing or shortness of breath.   Marland Kitchen alendronate (FOSAMAX) 70 MG tablet Take 70 mg by mouth every Wednesday. Take with a full glass of water on an empty stomach.  Marland Kitchen aspirin EC 81 MG tablet Take 81 mg by mouth daily.  . Calcium Carb-Cholecalciferol (CALCIUM 600/VITAMIN D3 PO) Take 1 tablet by mouth daily.  . carboxymethylcellulose (REFRESH  PLUS) 0.5 % SOLN Place 1 drop into both eyes 3 (three) times daily as needed (dry/irritated eyes.).  Marland Kitchen carvedilol (COREG) 6.25 MG tablet Take 6.25 mg by mouth 2 (two) times daily with a meal.   . Cholecalciferol (VITAMIN D3) 50 MCG (2000 UT) TABS Take 2,000 Units by mouth every evening.  . clopidogrel (PLAVIX) 75 MG tablet TAKE 1 TABLET BY MOUTH ONCE DAILY AT 6 AM  . docusate sodium (COLACE) 100 MG capsule Take 100 mg by mouth 2 (two) times daily as needed for mild constipation.  . Fish Oil-Cholecalciferol (OMEGA-3 + VITAMIN D3 PO) Take 1 tablet by mouth daily at 12 noon.  . fluticasone (FLONASE) 50 MCG/ACT nasal spray Place 1  spray into both nostrils daily as needed for rhinitis (runny nose/allergies.).  Marland Kitchen gabapentin (NEURONTIN) 300 MG capsule Take 300 mg by mouth 5 (five) times daily.   Marland Kitchen glucose blood test strip Accu-Chek Aviva Plus test strips  Use to check BS daily.  Marland Kitchen levothyroxine (SYNTHROID, LEVOTHROID) 125 MCG tablet Take 125 mcg by mouth daily before breakfast.  . lisinopril (PRINIVIL,ZESTRIL) 10 MG tablet Take 10 mg by mouth daily.  . magnesium hydroxide (MILK OF MAGNESIA) 400 MG/5ML suspension Take 15-30 mLs by mouth daily as needed for mild constipation.  . metFORMIN (GLUCOPHAGE) 500 MG tablet Take 500 mg by mouth 2 (two) times daily.  . Multiple Vitamin (MULTIVITAMIN WITH MINERALS) TABS tablet Take 1 tablet by mouth 3 (three) times a week.  . Multiple Vitamins-Minerals (PRESERVISION AREDS 2 PO) Take 1 tablet by mouth in the morning and at bedtime.  . rosuvastatin (CRESTOR) 40 MG tablet Take 1 tablet (40 mg total) by mouth daily. (Patient taking differently: Take 40 mg by mouth every evening.)     Allergies:   Patient has no known allergies.   Social History   Socioeconomic History  . Marital status: Widowed    Spouse name: Not on file  . Number of children: Not on file  . Years of education: Not on file  . Highest education level: Not on file  Occupational History  . Not on file  Tobacco Use  . Smoking status: Former Smoker    Types: Cigarettes    Quit date: 03/30/2019    Years since quitting: 1.1  . Smokeless tobacco: Never Used  Vaping Use  . Vaping Use: Never used  Substance and Sexual Activity  . Alcohol use: No  . Drug use: No  . Sexual activity: Not Currently  Other Topics Concern  . Not on file  Social History Narrative  . Not on file   Social Determinants of Health   Financial Resource Strain: Not on file  Food Insecurity: Not on file  Transportation Needs: Not on file  Physical Activity: Not on file  Stress: Not on file  Social Connections: Not on file     Family  History: The patient's family history includes Colon cancer in her sister; Diabetes in her brother; Heart disease in her brother and mother.  ROS:   Review of Systems  Constitution: Negative for decreased appetite, fever and weight gain.  HENT: Negative for congestion, ear discharge, hoarse voice and sore throat.   Eyes: Negative for discharge, redness, vision loss in right eye and visual halos.  Cardiovascular: Negative for chest pain, dyspnea on exertion, leg swelling, orthopnea and palpitations.  Respiratory: Negative for cough, hemoptysis, shortness of breath and snoring.   Endocrine: Negative for heat intolerance and polyphagia.  Hematologic/Lymphatic: Negative for bleeding problem. Does  not bruise/bleed easily.  Skin: Negative for flushing, nail changes, rash and suspicious lesions.  Musculoskeletal: Negative for arthritis, joint pain, muscle cramps, myalgias, neck pain and stiffness.  Gastrointestinal: Negative for abdominal pain, bowel incontinence, diarrhea and excessive appetite.  Genitourinary: Negative for decreased libido, genital sores and incomplete emptying.  Neurological: Negative for brief paralysis, focal weakness, headaches and loss of balance.  Psychiatric/Behavioral: Negative for altered mental status, depression and suicidal ideas.  Allergic/Immunologic: Negative for HIV exposure and persistent infections.    EKGs/Labs/Other Studies Reviewed:    The following studies were reviewed today:   EKG: None today  Recent Labs: 07/03/2019: ALT 19 07/05/2019: Magnesium 1.8 07/06/2019: BUN 21; Creatinine, Ser 0.92; Hemoglobin 9.1; Platelets 208; Potassium 4.2; Sodium 135  Recent Lipid Panel No results found for: CHOL, TRIG, HDL, CHOLHDL, VLDL, LDLCALC, LDLDIRECT  Physical Exam:    VS:  BP 128/78   Pulse 88   Ht 5\' 6"  (1.676 m)   Wt 213 lb 3.2 oz (96.7 kg)   SpO2 96%   BMI 34.41 kg/m     Wt Readings from Last 3 Encounters:  05/29/20 213 lb 3.2 oz (96.7 kg)   08/03/19 201 lb (91.2 kg)  07/09/19 196 lb (88.9 kg)     GEN: Well nourished, well developed in no acute distress HEENT: Normal NECK: No JVD; No carotid bruits LYMPHATICS: No lymphadenopathy CARDIAC: S1S2 noted,RRR, no murmurs, rubs, gallops RESPIRATORY:  Clear to auscultation without rales, wheezing or rhonchi  ABDOMEN: Soft, non-tender, non-distended, +bowel sounds, no guarding. EXTREMITIES: No edema, No cyanosis, no clubbing MUSCULOSKELETAL:  No deformity  SKIN: Warm and dry NEUROLOGIC:  Alert and oriented x 3, non-focal PSYCHIATRIC:  Normal affect, good insight  ASSESSMENT:    1. Coronary artery disease involving native coronary artery of native heart without angina pectoris   2. Mixed hyperlipidemia   3. Obesity (BMI 30-39.9)   4. Prediabetes   5. AAA (abdominal aortic aneurysm) without rupture (HCC)    PLAN:     1.  No angina symptoms.  Continue patient on her aspirin 81 mg daily, Plavix 75 mg and Crestor 40 mg daily.  2.  AAA -  She is status post EVAR and appears to be doing well.  Continue her dual antiplatelet therapy.  She follows with vascular surgery.  3.  Blood pressure is acceptable, continue with current antihypertensive regimen.  4.  Hyperlipidemia - continue with current statin medication.  5.  The patient understands the need to lose weight with diet and exercise. We have discussed specific strategies for this.  The patient is in agreement with the above plan. The patient left the office in stable condition.  The patient will follow up in   Medication Adjustments/Labs and Tests Ordered: Current medicines are reviewed at length with the patient today.  Concerns regarding medicines are outlined above.  No orders of the defined types were placed in this encounter.  No orders of the defined types were placed in this encounter.   Patient Instructions   Medication Instructions:  Your physician recommends that you continue on your current medications  as directed. Please refer to the Current Medication list given to you today.  *If you need a refill on your cardiac medications before your next appointment, please call your pharmacy*   Lab Work: None If you have labs (blood work) drawn today and your tests are completely normal, you will receive your results only by: Marland Kitchen. MyChart Message (if you have MyChart) OR . A paper  copy in the mail If you have any lab test that is abnormal or we need to change your treatment, we will call you to review the results.   Testing/Procedures: None   Follow-Up: At [facility], you and your health needs are our priority.  As part of our continuing mission to provide you with exceptional heart care, we have created designated Provider Care Teams.  These Care Teams include your primary Cardiologist (physician) and Advanced Practice Providers (APPs -  Physician Assistants and Nurse Practitioners) who all work together to provide you with the care you need, when you need it.  We recommend signing up for the patient portal called "MyChart".  Sign up information is provided on this After Visit Summary.  MyChart is used to connect with patients for Virtual Visits (Telemedicine).  Patients are able to view lab/test results, encounter notes, upcoming appointments, etc.  Non-urgent messages can be sent to your provider as well.   To learn more about what you can do with MyChart, go to [email or web address].    Your next appointment:   1 year(s)  The format for your next appointment:   In Person  Provider:   Thomasene Ripple, DO   Other Instructions    Mediterranean Diet A Mediterranean diet refers to food and lifestyle choices that are based on the traditions of countries located on the [place]. This way of eating has been shown to help prevent certain conditions and improve outcomes for people who have chronic diseases, like kidney disease and heart disease. What are tips for following this  plan? Lifestyle  Cook and eat meals together with your family, when possible.  Drink enough fluid to keep your urine clear or pale yellow.  Be physically active every day. This includes: ? Aerobic exercise like running or swimming. ? Leisure activities like gardening, walking, or housework.  Get 7-8 hours of sleep each night.  If recommended by your health care provider, drink red wine in moderation. This means 1 glass a day for nonpregnant women and 2 glasses a day for men. A glass of wine equals 5 oz (150 mL). Reading food labels  Check the serving size of packaged foods. For foods such as rice and pasta, the serving size refers to the amount of cooked product, not dry.  Check the total fat in packaged foods. Avoid foods that have saturated fat or trans fats.  Check the ingredients list for added sugars, such as corn syrup.   Shopping  At the grocery store, buy most of your food from the areas near the walls of the store. This includes: ? Fresh fruits and vegetables (produce). ? Grains, beans, nuts, and seeds. Some of these may be available in unpackaged forms or large amounts (in bulk). ? Fresh seafood. ? Poultry and eggs. ? Low-fat dairy products.  Buy whole ingredients instead of prepackaged foods.  Buy fresh fruits and vegetables in-season from local farmers markets.  Buy frozen fruits and vegetables in resealable bags.  If you do not have access to quality fresh seafood, buy precooked frozen shrimp or canned fish, such as tuna, salmon, or sardines.  Buy small amounts of raw or cooked vegetables, salads, or olives from the deli or salad bar at your store.  Stock your pantry so you always have certain foods on hand, such as olive oil, canned tuna, canned tomatoes, rice, pasta, and beans. Cooking  Cook foods with extra-virgin olive oil instead of using butter or other vegetable oils.  Have meat  as a side dish, and have vegetables or grains as your main dish. This  means having meat in small portions or adding small amounts of meat to foods like pasta or stew.  Use beans or vegetables instead of meat in common dishes like chili or lasagna.  Experiment with different cooking methods. Try roasting or broiling vegetables instead of steaming or sauteing them.  Add frozen vegetables to soups, stews, pasta, or rice.  Add nuts or seeds for added healthy fat at each meal. You can add these to yogurt, salads, or vegetable dishes.  Marinate fish or vegetables using olive oil, lemon juice, garlic, and fresh herbs. Meal planning  Plan to eat 1 vegetarian meal one day each week. Try to work up to 2 vegetarian meals, if possible.  Eat seafood 2 or more times a week.  Have healthy snacks readily available, such as: ? Vegetable sticks with hummus. ? [place] yogurt. ? Fruit and nut trail mix.  Eat balanced meals throughout the week. This includes: ? Fruit: 2-3 servings a day ? Vegetables: 4-5 servings a day ? Low-fat dairy: 2 servings a day ? Fish, poultry, or lean meat: 1 serving a day ? Beans and legumes: 2 or more servings a week ? Nuts and seeds: 1-2 servings a day ? Whole grains: 6-8 servings a day ? Extra-virgin olive oil: 3-4 servings a day  Limit red meat and sweets to only a few servings a month   What are my food choices?  Mediterranean diet ? Recommended  Grains: Whole-grain pasta. Brown rice. Bulgar wheat. Polenta. Couscous. Whole-wheat bread. Orpah Cobb.  Vegetables: Artichokes. Beets. Broccoli. Cabbage. Carrots. Eggplant. Green beans. Chard. Kale. Spinach. Onions. Leeks. Peas. Squash. Tomatoes. Peppers. Radishes.  Fruits: Apples. Apricots. Avocado. Berries. Bananas. Cherries. Dates. Figs. Grapes. Lemons. Melon. Oranges. Peaches. Plums. Pomegranate.  Meats and other protein foods: Beans. Almonds. Sunflower seeds. Pine nuts. Peanuts. Cod. Salmon. Scallops. Shrimp. Tuna. Tilapia. Clams. Oysters. Eggs.  Dairy: Low-fat milk. Cheese.  Greek yogurt.  Beverages: Water. Red wine. Herbal tea.  Fats and oils: Extra virgin olive oil. Avocado oil. Grape seed oil.  Sweets and desserts: [place] yogurt with honey. Baked apples. Poached pears. Trail mix.  Seasoning and other foods: Basil. Cilantro. Coriander. Cumin. Mint. Parsley. Sage. Rosemary. Tarragon. Garlic. Oregano. Thyme. Pepper. Balsalmic vinegar. Tahini. Hummus. Tomato sauce. Olives. Mushrooms. ? Limit these  Grains: Prepackaged pasta or rice dishes. Prepackaged cereal with added sugar.  Vegetables: Deep fried potatoes (french fries).  Fruits: Fruit canned in syrup.  Meats and other protein foods: Beef. Pork. Lamb. Poultry with skin. Hot dogs. Tomasa Blase.  Dairy: Ice cream. Sour cream. Whole milk.  Beverages: Juice. Sugar-sweetened soft drinks. Beer. Liquor and spirits.  Fats and oils: Butter. Canola oil. Vegetable oil. Beef fat (tallow). Lard.  Sweets and desserts: Cookies. Cakes. Pies. Candy.  Seasoning and other foods: Mayonnaise. Premade sauces and marinades. The items listed may not be a complete list. Talk with your dietitian about what dietary choices are right for you. Summary  The Mediterranean diet includes both food and lifestyle choices.  Eat a variety of fresh fruits and vegetables, beans, nuts, seeds, and whole grains.  Limit the amount of red meat and sweets that you eat.  Talk with your health care provider about whether it is safe for you to drink red wine in moderation. This means 1 glass a day for nonpregnant women and 2 glasses a day for men. A glass of wine equals 5 oz (150 mL). This information  is not intended to replace advice given to you by your health care provider. Make sure you discuss any questions you have with your health care provider. Document Revised: 11/13/2015 Document Reviewed: 11/06/2015 Elsevier Patient Education  2020 [employer].     Adopting a Healthy Lifestyle.  Know what a healthy weight is for you (roughly BMI  <25) and aim to maintain this   Aim for 7+ servings of fruits and vegetables daily   65-80+ fluid ounces of water or unsweet tea for healthy kidneys   Limit to max 1 drink of alcohol per day; avoid smoking/tobacco   Limit animal fats in diet for cholesterol and heart health - choose grass fed whenever available   Avoid highly processed foods, and foods high in saturated/trans fats   Aim for low stress - take time to unwind and care for your mental health   Aim for 150 min of moderate intensity exercise weekly for heart health, and weights twice weekly for bone health   Aim for 7-9 hours of sleep daily   When it comes to diets, agreement about the perfect plan isnt easy to find, even among the experts. Experts at the [facility] [employer] developed an idea known as the Healthy Eating Plate. Just imagine a plate divided into logical, healthy portions.   The emphasis is on diet quality:   Load up on vegetables and fruits - one-half of your plate: Aim for color and variety, and remember that potatoes dont count.   Go for whole grains - one-quarter of your plate: Whole wheat, barley, wheat berries, quinoa, oats, brown rice, and foods made with them. If you want pasta, go with whole wheat pasta.   Protein power - one-quarter of your plate: Fish, chicken, beans, and nuts are all healthy, versatile protein sources. Limit red meat.   The diet, however, does go beyond the plate, offering a few other suggestions.   Use healthy plant oils, such as olive, canola, soy, corn, sunflower and peanut. Check the labels, and avoid partially hydrogenated oil, which have unhealthy trans fats.   If youre thirsty, drink water. Coffee and tea are good in moderation, but skip sugary drinks and limit milk and dairy products to one or two daily servings.   The type of carbohydrate in the diet is more important than the amount. Some sources of carbohydrates, such as vegetables, fruits, whole  grains, and beans-are healthier than others.   Finally, stay active  Signed, Thomasene Ripple, DO  05/29/2020 1:43 PM    [facility]

## 2020-05-29 NOTE — Patient Instructions (Addendum)
Medication Instructions:  Your physician recommends that you continue on your current medications as directed. Please refer to the Current Medication list given to you today.  *If you need a refill on your cardiac medications before your next appointment, please call your pharmacy*   Lab Work: None If you have labs (blood work) drawn today and your tests are completely normal, you will receive your results only by: Marland Kitchen MyChart Message (if you have MyChart) OR . A paper copy in the mail If you have any lab test that is abnormal or we need to change your treatment, we will call you to review the results.   Testing/Procedures: None   Follow-Up: At [facility], you and your health needs are our priority.  As part of our continuing mission to provide you with exceptional heart care, we have created designated Provider Care Teams.  These Care Teams include your primary Cardiologist (physician) and Advanced Practice Providers (APPs -  Physician Assistants and Nurse Practitioners) who all work together to provide you with the care you need, when you need it.  We recommend signing up for the patient portal called "MyChart".  Sign up information is provided on this After Visit Summary.  MyChart is used to connect with patients for Virtual Visits (Telemedicine).  Patients are able to view lab/test results, encounter notes, upcoming appointments, etc.  Non-urgent messages can be sent to your provider as well.   To learn more about what you can do with MyChart, go to [email or web address].    Your next appointment:   1 year(s)  The format for your next appointment:   In Person  Provider:   Berniece Salines, DO   Other Instructions    [place] refers to food and lifestyle choices that are based on the traditions of countries located on the [place]. This way of eating has been shown to help prevent certain conditions and improve outcomes for people who  have chronic diseases, like kidney disease and heart disease. What are tips for following this plan? Lifestyle  Cook and eat meals together with your family, when possible.  Drink enough fluid to keep your urine clear or pale yellow.  Be physically active every day. This includes: ? Aerobic exercise like running or swimming. ? Leisure activities like gardening, walking, or housework.  Get 7-8 hours of sleep each night.  If recommended by your health care provider, drink red wine in moderation. This means 1 glass a day for nonpregnant women and 2 glasses a day for men. A glass of wine equals 5 oz (150 mL). Reading food labels  Check the serving size of packaged foods. For foods such as rice and pasta, the serving size refers to the amount of cooked product, not dry.  Check the total fat in packaged foods. Avoid foods that have saturated fat or trans fats.  Check the ingredients list for added sugars, such as corn syrup.   Shopping  At the grocery store, buy most of your food from the areas near the walls of the store. This includes: ? Fresh fruits and vegetables (produce). ? Grains, beans, nuts, and seeds. Some of these may be available in unpackaged forms or large amounts (in bulk). ? Fresh seafood. ? Poultry and eggs. ? Low-fat dairy products.  Buy whole ingredients instead of prepackaged foods.  Buy fresh fruits and vegetables in-season from local farmers markets.  Buy frozen fruits and vegetables in resealable bags.  If you do not have access  to quality fresh seafood, buy precooked frozen shrimp or canned fish, such as tuna, salmon, or sardines.  Buy small amounts of raw or cooked vegetables, salads, or olives from the deli or salad bar at your store.  Stock your pantry so you always have certain foods on hand, such as olive oil, canned tuna, canned tomatoes, rice, pasta, and beans. Cooking  Cook foods with extra-virgin olive oil instead of using butter or other  vegetable oils.  Have meat as a side dish, and have vegetables or grains as your main dish. This means having meat in small portions or adding small amounts of meat to foods like pasta or stew.  Use beans or vegetables instead of meat in common dishes like chili or lasagna.  Experiment with different cooking methods. Try roasting or broiling vegetables instead of steaming or sauteing them.  Add frozen vegetables to soups, stews, pasta, or rice.  Add nuts or seeds for added healthy fat at each meal. You can add these to yogurt, salads, or vegetable dishes.  Marinate fish or vegetables using olive oil, lemon juice, garlic, and fresh herbs. Meal planning  Plan to eat 1 vegetarian meal one day each week. Try to work up to 2 vegetarian meals, if possible.  Eat seafood 2 or more times a week.  Have healthy snacks readily available, such as: ? Vegetable sticks with hummus. ? [place] yogurt. ? Fruit and nut trail mix.  Eat balanced meals throughout the week. This includes: ? Fruit: 2-3 servings a day ? Vegetables: 4-5 servings a day ? Low-fat dairy: 2 servings a day ? Fish, poultry, or lean meat: 1 serving a day ? Beans and legumes: 2 or more servings a week ? Nuts and seeds: 1-2 servings a day ? Whole grains: 6-8 servings a day ? Extra-virgin olive oil: 3-4 servings a day  Limit red meat and sweets to only a few servings a month   What are my food choices?  Mediterranean diet ? Recommended  Grains: Whole-grain pasta. Brown rice. Bulgar wheat. Polenta. Couscous. Whole-wheat bread. [place].  Vegetables: Artichokes. Beets. Broccoli. Cabbage. Carrots. Eggplant. Green beans. Chard. Kale. Spinach. Onions. Leeks. Peas. Squash. Tomatoes. Peppers. Radishes.  Fruits: Apples. Apricots. Avocado. Berries. Bananas. Cherries. Dates. Figs. Grapes. Lemons. Melon. Oranges. Peaches. Plums. Pomegranate.  Meats and other protein foods: Beans. Almonds. Sunflower seeds. Pine nuts. Peanuts.  [place]. Salmon. Scallops. Shrimp. High [place]. Tilapia. Clams. Oysters. Eggs.  Dairy: Low-fat milk. Cheese. Greek yogurt.  Beverages: Water. Red wine. Herbal tea.  Fats and oils: Extra virgin olive oil. Avocado oil. Grape seed oil.  Sweets and desserts: [place] yogurt with honey. Baked apples. Poached pears. Trail mix.  Seasoning and other foods: Basil. Cilantro. Coriander. Cumin. Mint. Parsley. Sage. Rosemary. Tarragon. Garlic. Oregano. Thyme. Pepper. Balsalmic vinegar. Tahini. Hummus. Tomato sauce. Olives. Mushrooms. ? Limit these  Grains: Prepackaged pasta or rice dishes. Prepackaged cereal with added sugar.  Vegetables: Deep fried potatoes (french fries).  Fruits: Fruit canned in syrup.  Meats and other protein foods: Beef. Pork. Lamb. Poultry with skin. Hot dogs. Cathy Haney.  Dairy: Ice cream. Sour cream. Whole milk.  Beverages: Juice. Sugar-sweetened soft drinks. Beer. Liquor and spirits.  Fats and oils: Butter. Canola oil. Vegetable oil. Beef fat (tallow). Lard.  Sweets and desserts: Cookies. Cakes. Pies. Candy.  Seasoning and other foods: Mayonnaise. Premade sauces and marinades. The items listed may not be a complete list. Talk with your dietitian about what dietary choices are right for you. Summary  The [place]  diet includes both food and lifestyle choices.  Eat a variety of fresh fruits and vegetables, beans, nuts, seeds, and whole grains.  Limit the amount of red meat and sweets that you eat.  Talk with your health care provider about whether it is safe for you to drink red wine in moderation. This means 1 glass a day for nonpregnant women and 2 glasses a day for men. A glass of wine equals 5 oz (150 mL). This information is not intended to replace advice given to you by your health care provider. Make sure you discuss any questions you have with your health care provider. Document Revised: 11/13/2015 Document Reviewed: 11/06/2015 Elsevier Patient Education  [place].     Diabetes Mellitus and Nutrition, Adult When you have diabetes, or diabetes mellitus, it is very important to have healthy eating habits because your blood sugar (glucose) levels are greatly affected by what you eat and drink. Eating healthy foods in the right amounts, at about the same times every day, can help you:  Control your blood glucose.  Lower your risk of heart disease.  Improve your blood pressure.  Reach or maintain a healthy weight. What can affect my meal plan? Every person with diabetes is different, and each person has different needs for a meal plan. Your health care provider may recommend that you work with a dietitian to make a meal plan that is best for you. Your meal plan may vary depending on factors such as:  The calories you need.  The medicines you take.  Your weight.  Your blood glucose, blood pressure, and cholesterol levels.  Your activity level.  Other health conditions you have, such as heart or kidney disease. How do carbohydrates affect me? Carbohydrates, also called carbs, affect your blood glucose level more than any other type of food. Eating carbs naturally raises the amount of glucose in your blood. Carb counting is a method for keeping track of how many carbs you eat. Counting carbs is important to keep your blood glucose at a healthy level, especially if you use insulin or take certain oral diabetes medicines. It is important to know how many carbs you can safely have in each meal. This is different for every person. Your dietitian can help you calculate how many carbs you should have at each meal and for each snack. How does alcohol affect me? Alcohol can cause a sudden decrease in blood glucose (hypoglycemia), especially if you use insulin or take certain oral diabetes medicines. Hypoglycemia can be a life-threatening condition. Symptoms of hypoglycemia, such as sleepiness, dizziness, and confusion, are similar to symptoms of having too  much alcohol.  Do not drink alcohol if: ? Your health care provider tells you not to drink. ? You are pregnant, may be pregnant, or are planning to become pregnant.  If you drink alcohol: ? Do not drink on an empty stomach. ? Limit how much you use to:  0-1 drink a day for women.  0-2 drinks a day for men. ? Be aware of how much alcohol is in your drink. In the [place] one drink equals one 12 oz bottle of beer (355 mL), one 5 oz glass of wine (148 mL), or one 1 oz glass of hard liquor (44 mL). ? Keep yourself hydrated with water, diet soda, or unsweetened iced tea.  Keep in mind that regular soda, juice, and other mixers may contain a lot of sugar and must be counted as carbs. What are tips  for following this plan? Reading food labels  Start by checking the serving size on the "Nutrition Facts" label of packaged foods and drinks. The amount of calories, carbs, fats, and other nutrients listed on the label is based on one serving of the item. Many items contain more than one serving per package.  Check the total grams (g) of carbs in one serving. You can calculate the number of servings of carbs in one serving by dividing the total carbs by 15. For example, if a food has 30 g of total carbs per serving, it would be equal to 2 servings of carbs.  Check the number of grams (g) of saturated fats and trans fats in one serving. Choose foods that have a low amount or none of these fats.  Check the number of milligrams (mg) of salt (sodium) in one serving. Most people should limit total sodium intake to less than 2,300 mg per day.  Always check the nutrition information of foods labeled as "low-fat" or "nonfat." These foods may be higher in added sugar or refined carbs and should be avoided.  Talk to your dietitian to identify your daily goals for nutrients listed on the label. Shopping  Avoid buying canned, pre-made, or processed foods. These foods tend to be high in fat, sodium, and added  sugar.  Shop around the outside edge of the grocery store. This is where you will most often find fresh fruits and vegetables, bulk grains, fresh meats, and fresh dairy. Cooking  Use low-heat cooking methods, such as baking, instead of high-heat cooking methods like deep frying.  Cook using healthy oils, such as olive, canola, or sunflower oil.  Avoid cooking with butter, cream, or high-fat meats. Meal planning  Eat meals and snacks regularly, preferably at the same times every day. Avoid going long periods of time without eating.  Eat foods that are high in fiber, such as fresh fruits, vegetables, beans, and whole grains. Talk with your dietitian about how many servings of carbs you can eat at each meal.  Eat 4-6 oz (112-168 g) of lean protein each day, such as lean meat, chicken, fish, eggs, or tofu. One ounce (oz) of lean protein is equal to: ? 1 oz (28 g) of meat, chicken, or fish. ? 1 egg. ?  cup (62 g) of tofu.  Eat some foods each day that contain healthy fats, such as avocado, nuts, seeds, and fish.   What foods should I eat? Fruits Berries. Apples. Oranges. Peaches. Apricots. Plums. Grapes. Mango. Papaya. Pomegranate. Kiwi. Cherries. Vegetables Lettuce. Spinach. Leafy greens, including kale, chard, collard greens, and mustard greens. Beets. Cauliflower. Cabbage. Broccoli. Carrots. Green beans. Tomatoes. Peppers. Onions. Cucumbers. Brussels sprouts. Grains Whole grains, such as whole-wheat or whole-grain bread, crackers, tortillas, cereal, and pasta. Unsweetened oatmeal. Quinoa. Brown or wild rice. Meats and other proteins Seafood. Poultry without skin. Lean cuts of poultry and beef. Tofu. Nuts. Seeds. Dairy Low-fat or fat-free dairy products such as milk, yogurt, and cheese. The items listed above may not be a complete list of foods and beverages you can eat. Contact a dietitian for more information. What foods should I avoid? Fruits Fruits canned with  syrup. Vegetables Canned vegetables. Frozen vegetables with butter or cream sauce. Grains Refined white flour and flour products such as bread, pasta, snack foods, and cereals. Avoid all processed foods. Meats and other proteins Fatty cuts of meat. Poultry with skin. Breaded or fried meats. Processed meat. Avoid saturated fats. Dairy Full-fat yogurt, cheese, or  milk. Beverages Sweetened drinks, such as soda or iced tea. The items listed above may not be a complete list of foods and beverages you should avoid. Contact a dietitian for more information. Questions to ask a health care provider  Do I need to meet with a diabetes educator?  Do I need to meet with a dietitian?  What number can I call if I have questions?  When are the best times to check my blood glucose? Where to find more information:  American Diabetes Association: diabetes.org  Academy of Nutrition and Dietetics: www.eatright.[employer] of Diabetes and Digestive and Kidney Diseases: [employer].dk  Association of Diabetes Care and Education Specialists: www.diabeteseducator.org Summary  It is important to have healthy eating habits because your blood sugar (glucose) levels are greatly affected by what you eat and drink.  A healthy meal plan will help you control your blood glucose and maintain a healthy lifestyle.  Your health care provider may recommend that you work with a dietitian to make a meal plan that is best for you.  Keep in mind that carbohydrates (carbs) and alcohol have immediate effects on your blood glucose levels. It is important to count carbs and to use alcohol carefully. This information is not intended to replace advice given to you by your health care provider. Make sure you discuss any questions you have with your health care provider. Document Revised: 02/20/2019 Document Reviewed: 02/20/2019 Elsevier Patient Education  2021 [place].    Heart-Healthy Eating  Plan Heart-healthy meal planning includes:  Eating less unhealthy fats.  Eating more healthy fats.  Making other changes in your diet. Talk with your doctor or a diet specialist (dietitian) to create an eating plan that is right for you. What are tips for following this plan? Cooking Avoid frying your food. Try to bake, boil, grill, or broil it instead. You can also reduce fat by:  Removing the skin from poultry.  Removing all visible fats from meats.  Steaming vegetables in water or broth. Meal planning  At meals, divide your plate into four equal parts: ? Fill one-half of your plate with vegetables and green salads. ? Fill one-fourth of your plate with whole grains. ? Fill one-fourth of your plate with lean protein foods.  Eat 4-5 servings of vegetables per day. A serving of vegetables is: ? 1 cup of raw or cooked vegetables. ? 2 cups of raw leafy greens.  Eat 4-5 servings of fruit per day. A serving of fruit is: ? 1 medium whole fruit. ?  cup of dried fruit. ?  cup of fresh, frozen, or canned fruit. ?  cup of 100% fruit juice.  Eat more foods that have soluble fiber. These are apples, broccoli, carrots, beans, peas, and barley. Try to get 20-30 g of fiber per day.  Eat 4-5 servings of nuts, legumes, and seeds per week: ? 1 serving of dried beans or legumes equals  cup after being cooked. ? 1 serving of nuts is  cup. ? 1 serving of seeds equals 1 tablespoon.   General information  Eat more home-cooked food. Eat less restaurant, buffet, and fast food.  Limit or avoid alcohol.  Limit foods that are high in starch and sugar.  Avoid fried foods.  Lose weight if you are overweight.  Keep track of how much salt (sodium) you eat. This is important if you have high blood pressure. Ask your doctor to tell you more about this.  Try to add vegetarian meals each  week. Fats  Choose healthy fats. These include olive oil and canola oil, flaxseeds, walnuts, almonds,  and seeds.  Eat more omega-3 fats. These include salmon, mackerel, sardines, tuna, flaxseed oil, and ground flaxseeds. Try to eat fish at least 2 times each week.  Check food labels. Avoid foods with trans fats or high amounts of saturated fat.  Limit saturated fats. ? These are often found in animal products, such as meats, butter, and cream. ? These are also found in plant foods, such as palm oil, palm kernel oil, and coconut oil.  Avoid foods with partially hydrogenated oils in them. These have trans fats. Examples are stick margarine, some tub margarines, cookies, crackers, and other baked goods. What foods can I eat? Fruits All fresh, canned (in natural juice), or frozen fruits. Vegetables Fresh or frozen vegetables (raw, steamed, roasted, or grilled). Green salads. Grains Most grains. Choose whole wheat and whole grains most of the time. Rice and pasta, including brown rice and pastas made with whole wheat. Meats and other proteins Lean, well-trimmed beef, veal, pork, and lamb. Chicken and [place] without skin. All fish and shellfish. Wild duck, rabbit, pheasant, and venison. Egg whites or low-cholesterol egg substitutes. Dried beans, peas, lentils, and tofu. Seeds and most nuts. Dairy Low-fat or nonfat cheeses, including ricotta and mozzarella. Skim or 1% milk that is liquid, powdered, or evaporated. Buttermilk that is made with low-fat milk. Nonfat or low-fat yogurt. Fats and oils Non-hydrogenated (trans-free) margarines. Vegetable oils, including soybean, sesame, sunflower, olive, peanut, safflower, corn, canola, and cottonseed. Salad dressings or mayonnaise made with a vegetable oil. Beverages Mineral water. Coffee and tea. Diet carbonated beverages. Sweets and desserts Sherbet, gelatin, and fruit ice. Small amounts of dark chocolate. Limit all sweets and desserts. Seasonings and condiments All seasonings and condiments. The items listed above may not be a complete list of  foods and drinks you can eat. Contact a dietitian for more options. What foods should I avoid? Fruits Canned fruit in heavy syrup. Fruit in cream or butter sauce. Fried fruit. Limit coconut. Vegetables Vegetables cooked in cheese, cream, or butter sauce. Fried vegetables. Grains Breads that are made with saturated or trans fats, oils, or whole milk. Croissants. Sweet rolls. Donuts. High-fat crackers, such as cheese crackers. Meats and other proteins Fatty meats, such as hot dogs, ribs, sausage, bacon, rib-eye roast or steak. High-fat deli meats, such as salami and bologna. Caviar. Domestic duck and goose. Organ meats, such as liver. Dairy Cream, sour cream, cream cheese, and creamed cottage cheese. Whole-milk cheeses. Whole or 2% milk that is liquid, evaporated, or condensed. Whole buttermilk. Cream sauce or high-fat cheese sauce. Yogurt that is made from whole milk. Fats and oils Meat fat, or shortening. Cocoa butter, hydrogenated oils, palm oil, coconut oil, palm kernel oil. Solid fats and shortenings, including bacon fat, salt pork, lard, and butter. Nondairy cream substitutes. Salad dressings with cheese or sour cream. Beverages Regular sodas and juice drinks with added sugar. Sweets and desserts Frosting. Pudding. Cookies. Cakes. Pies. Milk chocolate or white chocolate. Buttered syrups. Full-fat ice cream or ice cream drinks. The items listed above may not be a complete list of foods and drinks to avoid. Contact a dietitian for more information. Summary  Heart-healthy meal planning includes eating less unhealthy fats, eating more healthy fats, and making other changes in your diet.  Eat a balanced diet. This includes fruits and vegetables, low-fat or nonfat dairy, lean protein, nuts and legumes, whole grains, and heart-healthy oils and fats.  This information is not intended to replace advice given to you by your health care provider. Make sure you discuss any questions you have with your  health care provider. Document Revised: 05/19/2017 Document Reviewed: 04/22/2017 Elsevier Patient Education  2021 [place] Following a healthy eating pattern may help you to achieve and maintain a healthy body weight, reduce the risk of chronic disease, and live a long and productive life. It is important to follow a healthy eating pattern at an appropriate calorie level for your body. Your nutritional needs should be met primarily through food by choosing a variety of nutrient-rich foods. What are tips for following this plan? Reading food labels  Read labels and choose the following: ? Reduced or low sodium. ? Juices with 100% fruit juice. ? Foods with low saturated fats and high polyunsaturated and monounsaturated fats. ? Foods with whole grains, such as whole wheat, cracked wheat, brown rice, and wild rice. ? Whole grains that are fortified with folic acid. This is recommended for women who are pregnant or who want to become pregnant.  Read labels and avoid the following: ? Foods with a lot of added sugars. These include foods that contain brown sugar, corn sweetener, corn syrup, dextrose, fructose, glucose, high-fructose corn syrup, honey, invert sugar, lactose, malt syrup, maltose, molasses, raw sugar, sucrose, trehalose, or turbinado sugar.  Do not eat more than the following amounts of added sugar per day:  6 teaspoons (25 g) for women.  9 teaspoons (38 g) for men. ? Foods that contain processed or refined starches and grains. ? Refined grain products, such as white flour, degermed cornmeal, white bread, and white rice. Shopping  Choose nutrient-rich snacks, such as vegetables, whole fruits, and nuts. Avoid high-calorie and high-sugar snacks, such as potato chips, fruit snacks, and candy.  Use oil-based dressings and spreads on foods instead of solid fats such as butter, stick margarine, or cream cheese.  Limit pre-made sauces, mixes, and "instant"  products such as flavored rice, instant noodles, and ready-made pasta.  Try more plant-protein sources, such as tofu, tempeh, black beans, edamame, lentils, nuts, and seeds.  Explore eating plans such as the Mediterranean diet or vegetarian diet. Cooking  Use oil to saut or stir-fry foods instead of solid fats such as butter, stick margarine, or lard.  Try baking, boiling, grilling, or broiling instead of frying.  Remove the fatty part of meats before cooking.  Steam vegetables in water or broth. Meal planning  At meals, imagine dividing your plate into fourths: ? One-half of your plate is fruits and vegetables. ? One-fourth of your plate is whole grains. ? One-fourth of your plate is protein, especially lean meats, poultry, eggs, tofu, beans, or nuts.  Include low-fat dairy as part of your daily diet.   Lifestyle  Choose healthy options in all settings, including home, work, school, restaurants, or stores.  Prepare your food safely: ? Wash your hands after handling raw meats. ? Keep food preparation surfaces clean by regularly washing with hot, soapy water. ? Keep raw meats separate from ready-to-eat foods, such as fruits and vegetables. ? Cook seafood, meat, poultry, and eggs to the recommended internal temperature. ? Store foods at safe temperatures. In general:  Keep cold foods at 43F (4.4C) or below.  Keep hot foods at 143F (60C) or above.  Keep your freezer at [facility] (-17.8C) or below.  Foods are no longer safe to eat when they have been between the temperatures of 40-143F (4.4-60C) for  more than 2 hours. What foods should I eat? Fruits Aim to eat 2 cup-equivalents of fresh, canned (in natural juice), or frozen fruits each day. Examples of 1 cup-equivalent of fruit include 1 small apple, 8 large strawberries, 1 cup canned fruit,  cup dried fruit, or 1 cup 100% juice. Vegetables Aim to eat 2-3 cup-equivalents of fresh and frozen vegetables each day, including  different varieties and colors. Examples of 1 cup-equivalent of vegetables include 2 medium carrots, 2 cups raw, leafy greens, 1 cup chopped vegetable (raw or cooked), or 1 medium baked potato. Grains Aim to eat 6 ounce-equivalents of whole grains each day. Examples of 1 ounce-equivalent of grains include 1 slice of bread, 1 cup ready-to-eat cereal, 3 cups popcorn, or  cup cooked rice, pasta, or cereal. Meats and other proteins Aim to eat 5-6 ounce-equivalents of protein each day. Examples of 1 ounce-equivalent of protein include 1 egg, 1/2 cup nuts or seeds, or 1 tablespoon (16 g) peanut butter. A cut of meat or fish that is the size of a deck of cards is about 3-4 ounce-equivalents.  Of the protein you eat each week, try to have at least 8 ounces come from seafood. This includes salmon, trout, herring, and anchovies. Dairy Aim to eat 3 cup-equivalents of fat-free or low-fat dairy each day. Examples of 1 cup-equivalent of dairy include 1 cup (240 mL) milk, 8 ounces (250 g) yogurt, 1 ounces (44 g) natural cheese, or 1 cup (240 mL) fortified soy milk. Fats and oils  Aim for about 5 teaspoons (21 g) per day. Choose monounsaturated fats, such as canola and olive oils, avocados, peanut butter, and most nuts, or polyunsaturated fats, such as sunflower, corn, and soybean oils, walnuts, pine nuts, sesame seeds, sunflower seeds, and flaxseed. Beverages  Aim for six 8-oz glasses of water per day. Limit coffee to three to five 8-oz cups per day.  Limit caffeinated beverages that have added calories, such as soda and energy drinks.  Limit alcohol intake to no more than 1 drink a day for nonpregnant women and 2 drinks a day for men. One drink equals 12 oz of beer (355 mL), 5 oz of wine (148 mL), or 1 oz of hard liquor (44 mL). Seasoning and other foods  Avoid adding excess amounts of salt to your foods. Try flavoring foods with herbs and spices instead of salt.  Avoid adding sugar to foods.  Try  using oil-based dressings, sauces, and spreads instead of solid fats. This information is based on general [place] nutrition guidelines. For more information, visit [employer].es. Exact amounts may vary based on your nutrition needs. Summary  A healthy eating plan may help you to maintain a healthy weight, reduce the risk of chronic diseases, and stay active throughout your life.  Plan your meals. Make sure you eat the right portions of a variety of nutrient-rich foods.  Try baking, boiling, grilling, or broiling instead of frying.  Choose healthy options in all settings, including home, work, school, restaurants, or stores. This information is not intended to replace advice given to you by your health care provider. Make sure you discuss any questions you have with your health care provider. Document Revised: 06/27/2017 Document Reviewed: 06/27/2017 Elsevier Patient Education  [place].

## 2020-09-15 ENCOUNTER — Ambulatory Visit (INDEPENDENT_AMBULATORY_CARE_PROVIDER_SITE_OTHER): Payer: Medicare Other | Admitting: Physician Assistant

## 2020-09-15 ENCOUNTER — Other Ambulatory Visit: Payer: Self-pay

## 2020-09-15 ENCOUNTER — Ambulatory Visit (HOSPITAL_COMMUNITY)
Admission: RE | Admit: 2020-09-15 | Discharge: 2020-09-15 | Disposition: A | Discharge status: Home / Self Care | Payer: Medicare Other | Source: Ambulatory Visit | Attending: Surgery | Admitting: Surgery

## 2020-09-15 VITALS — BP 145/71 | HR 72 | Temp 96.5°F | Resp 16 | Ht 64.5 in | Wt 212.0 lb

## 2020-09-15 DIAGNOSIS — I714 Abdominal aortic aneurysm, without rupture, unspecified: Secondary | ICD-10-CM

## 2020-09-15 DIAGNOSIS — I70212 Atherosclerosis of native arteries of extremities with intermittent claudication, left leg: Secondary | ICD-10-CM | POA: Diagnosis not present

## 2020-09-15 NOTE — Progress Notes (Signed)
Office Note     CC:  follow up Requesting Provider:  Maris Berger, MD  HPI: Cathy Haney is a 70 y.o. ([birth year]) female who presents to go over vascular studies related to endovascular repair of abdominal aortic aneurysm in April 2021 by Dr. Randie Heinz.  She denies any new or changing abdominal or back pain.  She has a known left proximal SFA occlusion.  She complains of claudication with pain, cramping, and burning in her left calf after walking about 2-3 blocks.  This does not keep her from performing her normal daily activity.  She also attributes some left lower extremity pain to her known lumbar spine stenosis.  She denies any rest pain or nonhealing wounds of left lower extremity.  She is on aspirin and statin daily.  She is a former smoker who quit in January 2021.   Past Medical History:  Diagnosis Date   AAA (abdominal aortic aneurysm) without rupture (HCC) 06/19/2019   Abdominal aortic aneurysm (HCC) 05/27/2017   Acquired hypothyroidism 07/06/2019   Allergy    Anemia    during pregnancy   Anxiety    hx   Aortic aneurysm of unspecified site, without rupture (HCC) 05/30/2017   Arthritis    Benign essential hypertension 05/30/2017   Benign hypertension 05/30/2017   CAD (coronary artery disease) 06/27/2019   Coronary artery calcification seen on CT scan    Depression    hx has meds as needed   Diverticulitis    Dyspnea    Hypothyroidism    Aquired   Mixed hyperlipidemia    Obesity (BMI 30-39.9) 06/19/2019   Pneumonia    in the past   Pre-diabetes    diet controlled, doesnt eat sweets   Prediabetes    Preoperative cardiovascular examination 06/01/2017   Shortness of breath 06/19/2019   Thyroid disease     Past Surgical History:  Procedure Laterality Date   ABDOMINAL AORTA STENT  07/05/2019   ABDOMINAL AORTIC ENDOVASCULAR STENT GRAFT (N/A Groin)    ABDOMINAL AORTIC ENDOVASCULAR STENT GRAFT N/A 07/05/2019   Procedure: ABDOMINAL AORTIC ENDOVASCULAR STENT GRAFT;  Surgeon: [name], MD;  Location: [facility] OR;  Service: Vascular;  Laterality: N/A;   BACK SURGERY     khyphoplasty   CARPAL TUNNEL RELEASE     COLONOSCOPY     ENDARTERECTOMY FEMORAL  07/05/2019   Procedure: Right Endarterectomy Femoral;  Surgeon: [name], MD;  Location: [facility] OR;  Service: Vascular;;   EXCISION OF SKIN LESION     left arm   LEFT HEART CATH AND CORONARY ANGIOGRAPHY N/A 06/27/2019   Procedure: LEFT HEART CATH AND CORONARY ANGIOGRAPHY;  Surgeon: Lyn Records, MD;  Location: [facility] INVASIVE CV LAB;  Service: Cardiovascular;  Laterality: N/A;   TOE SURGERY     nails removed   TONSILLECTOMY      Social History   Socioeconomic History   Marital status: Widowed    Spouse name: Not on file   Number of children: Not on file   Years of education: Not on file   Highest education level: Not on file  Occupational History   Not on file  Tobacco Use   Smoking status: Former    Pack years: 0.00    Types: Cigarettes    Quit date: 03/30/2019    Years since quitting: 1.4   Smokeless tobacco: Never  Vaping Use   Vaping Use: Never used  Substance and Sexual Activity   Alcohol use: No   Drug  use: No   Sexual activity: Not Currently  Other Topics Concern   Not on file  Social History Narrative   Not on file   Social Determinants of Health   Financial Resource Strain: Not on file  Food Insecurity: Not on file  Transportation Needs: Not on file  Physical Activity: Not on file  Stress: Not on file  Social Connections: Not on file  Intimate Partner Violence: Not on file    Family History  Problem Relation Age of Onset   Heart disease Mother    Diabetes Brother    Heart disease Brother    Colon cancer Sister     Current Outpatient Medications  Medication Sig Dispense Refill   albuterol (VENTOLIN HFA) 108 (90 Base) MCG/ACT inhaler Inhale into the lungs as needed for wheezing or shortness of breath.      alendronate (FOSAMAX) 70 MG tablet Take 70 mg by mouth  every Wednesday. Take with a full glass of water on an empty stomach.     aspirin EC 81 MG tablet Take 81 mg by mouth daily.     Calcium Carb-Cholecalciferol (CALCIUM 600/VITAMIN D3 PO) Take 1 tablet by mouth daily.     carboxymethylcellulose (REFRESH PLUS) 0.5 % SOLN Place 1 drop into both eyes 3 (three) times daily as needed (dry/irritated eyes.).     carvedilol (COREG) 6.25 MG tablet Take 6.25 mg by mouth 2 (two) times daily with a meal.      Cholecalciferol (VITAMIN D3) 50 MCG (2000 UT) TABS Take 2,000 Units by mouth every evening.     clopidogrel (PLAVIX) 75 MG tablet TAKE 1 TABLET BY MOUTH ONCE DAILY AT 6 AM 30 tablet 6   docusate sodium (COLACE) 100 MG capsule Take 100 mg by mouth 2 (two) times daily as needed for mild constipation.     Fish Oil-Cholecalciferol (OMEGA-3 + VITAMIN D3 PO) Take 1 tablet by mouth daily at 12 noon.     fluticasone (FLONASE) 50 MCG/ACT nasal spray Place 1 spray into both nostrils daily as needed for rhinitis (runny nose/allergies.).     gabapentin (NEURONTIN) 300 MG capsule Take 300 mg by mouth 5 (five) times daily.      glucose blood test strip Accu-Chek Aviva Plus test strips  Use to check BS daily.     levothyroxine (SYNTHROID, LEVOTHROID) 125 MCG tablet Take 125 mcg by mouth daily before breakfast.     lisinopril (PRINIVIL,ZESTRIL) 10 MG tablet Take 10 mg by mouth daily.     magnesium hydroxide (MILK OF MAGNESIA) 400 MG/5ML suspension Take 15-30 mLs by mouth daily as needed for mild constipation.     metFORMIN (GLUCOPHAGE) 500 MG tablet Take 500 mg by mouth 3 (three) times daily. Per patient dose change to tid     Multiple Vitamin (MULTIVITAMIN WITH MINERALS) TABS tablet Take 1 tablet by mouth 3 (three) times a week.     Multiple Vitamins-Minerals (PRESERVISION AREDS 2 PO) Take 1 tablet by mouth in the morning and at bedtime.     rosuvastatin (CRESTOR) 40 MG tablet Take 1 tablet (40 mg total) by mouth daily. (Patient taking differently: Take 40 mg by mouth  every evening.) 90 tablet 3   No current facility-administered medications for this visit.    No Known Allergies   REVIEW OF SYSTEMS:   [X]  denotes positive finding, [ ]  denotes negative finding Cardiac  Comments:  Chest pain or chest pressure:    Shortness of breath upon exertion:    Short of  breath when lying flat:    Irregular heart rhythm:        Vascular    Pain in calf, thigh, or hip brought on by ambulation:    Pain in feet at night that wakes you up from your sleep:     Blood clot in your veins:    Leg swelling:         Pulmonary    Oxygen at home:    Productive cough:     Wheezing:         Neurologic    Sudden weakness in arms or legs:     Sudden numbness in arms or legs:     Sudden onset of difficulty speaking or slurred speech:    Temporary loss of vision in one eye:     Problems with dizziness:         Gastrointestinal    Blood in stool:     Vomited blood:         Genitourinary    Burning when urinating:     Blood in urine:        Psychiatric    Major depression:         Hematologic    Bleeding problems:    Problems with blood clotting too easily:        Skin    Rashes or ulcers:        Constitutional    Fever or chills:      PHYSICAL EXAMINATION:  Vitals:   09/15/20 1005  BP: (!) 145/71  Pulse: 72  Resp: 16  Temp: (!) 96.5 F (35.8 C)  TempSrc: Temporal  SpO2: 99%  Weight: 212 lb (96.2 kg)  Height: 5' 4.5" (1.638 m)    General:  WDWN in NAD; vital signs documented above Gait: Not observed HENT: WNL, normocephalic Pulmonary: normal non-labored breathing  Cardiac: regular HR Abdomen: soft, NT, no masses Skin: without rashes Vascular Exam/Pulses:  Right Left  Radial 2+ (normal) 2+ (normal)  DP 2+ (normal) absent  PT 1+ (weak) absent   Extremities: without ischemic changes, without Gangrene , without cellulitis; without open wounds;  Musculoskeletal: no muscle wasting or atrophy  Neurologic: A&O X 3;  No focal weakness or  paresthesias are detected Psychiatric:  The pt has Normal affect.   Non-Invasive Vascular Imaging:   AAA sac with maximum diameter of 4.8 cm    ASSESSMENT/PLAN:: 70 y.o. female here for follow up for surveillance of EVAR   -AAA sac now measuring 4.8 cm which is down from preoperative measurement of 5.6 cm -Recheck EVAR duplex in 1 year -Claudication symptoms of left lower extremity are stable with known proximal left SFA occlusion; encouraged walking program; check ABI in 1 year -Continue aspirin and statin daily   Emilie Rutter, PA-C [facility] [phone]  Clinic MD:   Myra Gianotti

## 2021-07-15 ENCOUNTER — Ambulatory Visit: Payer: Medicare Other | Admitting: Cardiology

## 2021-07-24 ENCOUNTER — Ambulatory Visit (INDEPENDENT_AMBULATORY_CARE_PROVIDER_SITE_OTHER): Payer: Medicare Other | Admitting: Cardiology

## 2021-07-24 ENCOUNTER — Encounter: Payer: Self-pay | Admitting: Cardiology

## 2021-07-24 VITALS — BP 130/64 | HR 75 | Ht 64.5 in | Wt 200.0 lb

## 2021-07-24 DIAGNOSIS — I2510 Atherosclerotic heart disease of native coronary artery without angina pectoris: Secondary | ICD-10-CM | POA: Diagnosis not present

## 2021-07-24 DIAGNOSIS — E782 Mixed hyperlipidemia: Secondary | ICD-10-CM

## 2021-07-24 DIAGNOSIS — E119 Type 2 diabetes mellitus without complications: Secondary | ICD-10-CM

## 2021-07-24 DIAGNOSIS — I10 Essential (primary) hypertension: Secondary | ICD-10-CM

## 2021-07-24 NOTE — Patient Instructions (Signed)

## 2021-07-24 NOTE — Progress Notes (Signed)
?Cardiology Office Note:   ? ?Date:  07/24/2021  ? ?ID:  Cathy Haney, DOB Jun 02, 1950, MRN [id number] ? ?PCP:  Maris Berger, MD  ?Cardiologist:  Norman Herrlich, MD   ? ?Referring MD: Maris Berger, MD  ? ? ?ASSESSMENT:   ? ?1. Coronary artery disease involving native coronary artery of native heart without angina pectoris   ?2. Mixed hyperlipidemia   ?3. Benign hypertension   ?4. Type 2 diabetes mellitus without complication, without long-term current use of insulin (HCC)   ? ?PLAN:   ? ?In order of problems listed above: ? ?[place] continues to do well with CAD no anginal discomfort New [place] Heart Association class I continue her aspirin clopidogrel and high intensity statin beta-blocker ?Continue her high intensity statin due for labs soon in her PCP office goal LDL less than 70 ?At target continue lisinopril ?See discussion below consider GLP-1 agonist ? ? ?Next appointment: 9 months ? ? ?Medication Adjustments/Labs and Tests Ordered: ?Current medicines are reviewed at length with the patient today.  Concerns regarding medicines are outlined above.  ?No orders of the defined types were placed in this encounter. ? ?No orders of the defined types were placed in this encounter. ? ? ?Chief Complaint  ?Patient presents with  ? Annual Exam  ? Coronary Artery Disease  ? ? ?History of Present Illness:   ? ?Cathy Haney is a 71 y.o. female with a hx of  coronary artery disease recent left heart cath with moderate three-vessel CAD felt to be best treated medically and which also showed saccular LAD proximal aneurysm, abdominal   aortic aneurysm status post EVAR, on July 05, 2019, hypertension, hyperlipidemia, hypothyroidism  last seen 05/29/2020.  Cardiac CTA that was performed prior to coronary angiography did not demonstrate aneurysm in the LAD. ? ?Compliance with diet, lifestyle and medications: Yes ? ?Remains very vigorous active watches a 50-year-old and except for back and hip pain has no limitations in her  abilities. ?She has had no angina has not needed nitroglycerin no edema shortness of breath palpitation or syncope ?She has my advice about diabetic treatment A1c she tells me is elevated in the range of 8. ?For cardioprotective approach she did well with a GLP-1 agent such as Victoza or Trulicity Mounjaro ?She has trouble tolerating metformin GI upset patients do better with Glucophage Exar another option is to combine with an SGL 2 inhibitor as these titrate product to avoid the GI symptoms such as Synjardy ?She has upcoming labs we will discuss with her PCP ?She tolerates her statin without muscle pain or weakness ? ?06/27/2019: ?Procedures ? ?LEFT HEART CATH AND CORONARY ANGIOGRAPHY  ? ?Conclusion ? ?Heavy three-vessel coronary calcification. ?Proximal LAD saccular aneurysm.  Moderate diffuse LAD disease with 60 to 70% distal eccentric stenosis. ?Ostial eccentric 50% circumflex followed by proximal eccentric 60 to 70% stenosis. ?Moderate to severe diffuse right coronary disease, tortuosity, including 50% proximal stenosis and 60 to 70% mid to distal stenosis. ?Normal LV systolic function with EF 65%.  EDP is normal. ? ?Coronary Diagrams ? ?Diagnostic ?Dominance: Right ? ?Past Medical History:  ?Diagnosis Date  ? AAA (abdominal aortic aneurysm) without rupture (HCC) 06/19/2019  ? Abdominal aortic aneurysm (HCC) 05/27/2017  ? Acquired hypothyroidism 07/06/2019  ? Allergy   ? Anemia   ? during pregnancy  ? Anxiety   ? hx  ? Aortic aneurysm of unspecified site, without rupture (HCC) 05/30/2017  ? Arthritis   ? Benign essential hypertension 05/30/2017  ? Benign  hypertension 05/30/2017  ? CAD (coronary artery disease) 06/27/2019  ? Coronary artery calcification seen on CT scan   ? Depression   ? hx has meds as needed  ? Diverticulitis   ? Dyspnea   ? Hypothyroidism   ? Aquired  ? Mixed hyperlipidemia   ? Obesity (BMI 30-39.9) 06/19/2019  ? Pneumonia   ? in the past  ? Pre-diabetes   ? diet controlled, doesnt eat sweets  ?  Prediabetes   ? Preoperative cardiovascular examination 06/01/2017  ? Shortness of breath 06/19/2019  ? Thyroid disease   ? ? ?Past Surgical History:  ?Procedure Laterality Date  ? ABDOMINAL AORTA STENT  07/05/2019  ? ABDOMINAL AORTIC ENDOVASCULAR STENT GRAFT (N/A Groin)   ? ABDOMINAL AORTIC ENDOVASCULAR STENT GRAFT N/A 07/05/2019  ? Procedure: ABDOMINAL AORTIC ENDOVASCULAR STENT GRAFT;  Surgeon: [name], MD;  Location: [facility] OR;  Service: Vascular;  Laterality: N/A;  ? BACK SURGERY    ? khyphoplasty  ? CARPAL TUNNEL RELEASE    ? COLONOSCOPY    ? ENDARTERECTOMY FEMORAL  07/05/2019  ? Procedure: Right Endarterectomy Femoral;  Surgeon: [name], MD;  Location: [facility] OR;  Service: Vascular;;  ? EXCISION OF SKIN LESION    ? left arm  ? LEFT HEART CATH AND CORONARY ANGIOGRAPHY N/A 06/27/2019  ? Procedure: LEFT HEART CATH AND CORONARY ANGIOGRAPHY;  Surgeon: Lyn Records, MD;  Location: [facility] INVASIVE CV LAB;  Service: Cardiovascular;  Laterality: N/A;  ? TOE SURGERY    ? nails removed  ? TONSILLECTOMY    ? ? ?Current Medications: ?Current Meds  ?Medication Sig  ? albuterol (VENTOLIN HFA) 108 (90 Base) MCG/ACT inhaler Inhale into the lungs as needed for wheezing or shortness of breath.   ? alendronate (FOSAMAX) 70 MG tablet Take 70 mg by mouth every Wednesday. Take with a full glass of water on an empty stomach.  ? aspirin EC 81 MG tablet Take 81 mg by mouth daily.  ? Calcium Carb-Cholecalciferol (CALCIUM 600/VITAMIN D3 PO) Take 1 tablet by mouth daily.  ? carvedilol (COREG) 6.25 MG tablet Take 6.25 mg by mouth 2 (two) times daily with a meal.   ? Cholecalciferol (VITAMIN D3) 50 MCG (2000 UT) TABS Take 2,000 Units by mouth every evening.  ? clopidogrel (PLAVIX) 75 MG tablet TAKE 1 TABLET BY MOUTH ONCE DAILY AT 6 AM  ? Fish Oil-Cholecalciferol (OMEGA-3 + VITAMIN D3 PO) Take 1 tablet by mouth daily at 12 noon.  ? fluticasone (FLONASE) 50 MCG/ACT nasal spray Place 1 spray into both nostrils daily as needed  for rhinitis (runny nose/allergies.).  ? gabapentin (NEURONTIN) 300 MG capsule Take 300 mg by mouth 5 (five) times daily.   ? glucose blood test strip Accu-Chek Aviva Plus test strips ? Use to check BS daily.  ? levothyroxine (SYNTHROID, LEVOTHROID) 125 MCG tablet Take 125 mcg by mouth daily before breakfast.  ? lisinopril (PRINIVIL,ZESTRIL) 10 MG tablet Take 10 mg by mouth daily.  ? metFORMIN (GLUCOPHAGE) 500 MG tablet Take 500 mg by mouth daily.  ? Multiple Vitamins-Minerals (PRESERVISION AREDS 2 PO) Take 1 tablet by mouth in the morning and at bedtime.  ? rosuvastatin (CRESTOR) 40 MG tablet Take 1 tablet (40 mg total) by mouth daily.  ?  ? ?Allergies:   Patient has no known allergies.  ? ?Social History  ? ?Socioeconomic History  ? Marital status: Widowed  ?  Spouse name: Not on file  ? Number of children: Not on file  ?  Years of education: Not on file  ? Highest education level: Not on file  ?Occupational History  ? Not on file  ?Tobacco Use  ? Smoking status: Former  ?  Types: Cigarettes  ?  Quit date: 03/30/2019  ?  Years since quitting: 2.3  ? Smokeless tobacco: Never  ?Vaping Use  ? Vaping Use: Never used  ?Substance and Sexual Activity  ? Alcohol use: No  ? Drug use: No  ? Sexual activity: Not Currently  ?Other Topics Concern  ? Not on file  ?Social History Narrative  ? Not on file  ? ?Social Determinants of Health  ? ?Financial Resource Strain: Not on file  ?Food Insecurity: Not on file  ?Transportation Needs: Not on file  ?Physical Activity: Not on file  ?Stress: Not on file  ?Social Connections: Not on file  ?  ? ?Family History: ?The patient's family history includes Colon cancer in her sister; Diabetes in her brother; Heart disease in her brother and mother. ?ROS:   ?Please see the history of present illness.    ?All other systems reviewed and are negative. ? ?EKGs/Labs/Other Studies Reviewed:   ? ?The following studies were reviewed today: ? ?EKG:  EKG ordered today and personally reviewed.  The ekg  ordered today demonstrates sinus rhythm and is normal ? ? ?Physical Exam:   ? ?VS:  BP 130/64 (BP Location: Right Arm, Patient Position: Sitting, Cuff Size: Normal)   Pulse 75   Ht 5' 4.5" (1.638 m)   Wt 200 lb (90.7 kg)

## 2021-09-19 ENCOUNTER — Other Ambulatory Visit: Payer: Self-pay

## 2021-09-19 DIAGNOSIS — I714 Abdominal aortic aneurysm, without rupture, unspecified: Secondary | ICD-10-CM

## 2021-09-19 DIAGNOSIS — I70212 Atherosclerosis of native arteries of extremities with intermittent claudication, left leg: Secondary | ICD-10-CM

## 2021-09-27 IMAGING — DX DG CHEST 1V PORT
1 series · 1 of 1 positions shown · non-contrast
Comparison: None.

CLINICAL DATA: Postoperative status.

EXAM:
PORTABLE CHEST 1 VIEW

[chest ap]
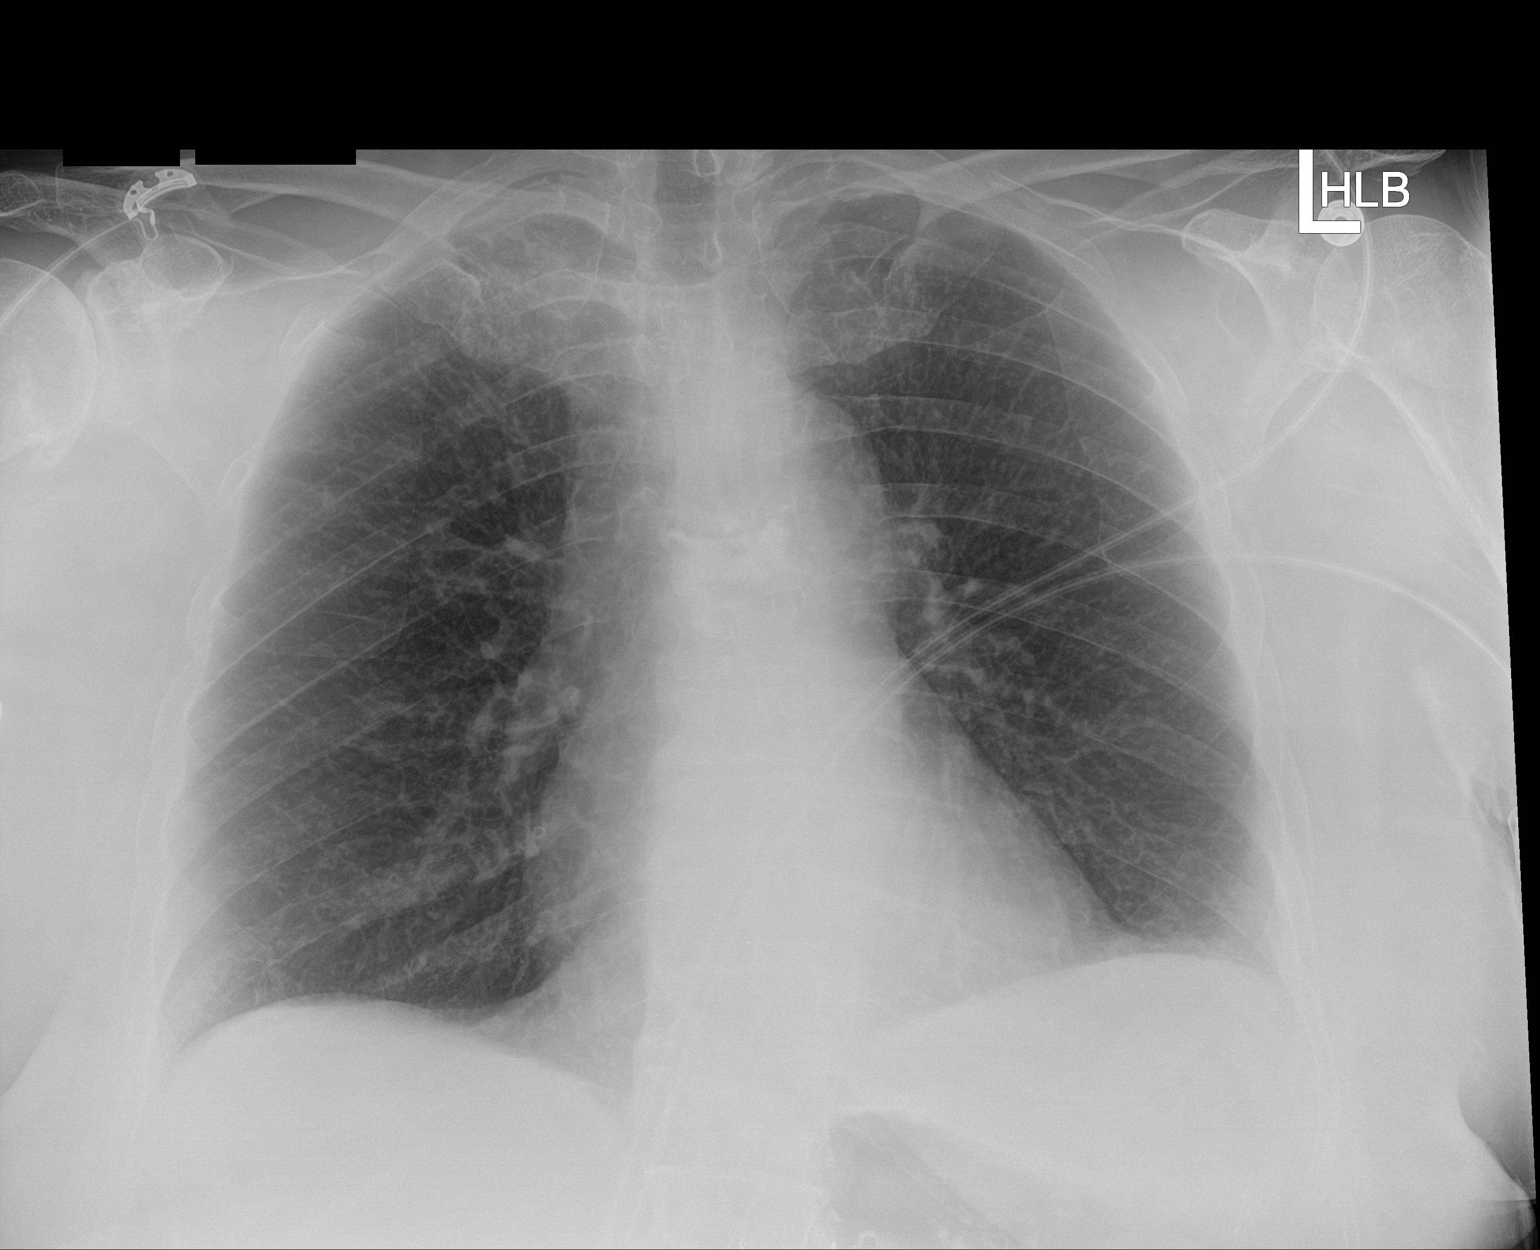

[1 of 1 positions shown; findings below may reference images not displayed]

FINDINGS: The heart size and mediastinal contours are within normal limits.
Both lungs are clear. The visualized skeletal structures are
unremarkable.
IMPRESSION: No active disease.

## 2021-10-14 ENCOUNTER — Encounter (HOSPITAL_COMMUNITY): Payer: Medicare Other

## 2021-10-14 ENCOUNTER — Ambulatory Visit: Payer: Medicare Other

## 2021-10-14 ENCOUNTER — Other Ambulatory Visit (HOSPITAL_COMMUNITY): Payer: Medicare Other

## 2021-10-15 ENCOUNTER — Encounter (HOSPITAL_COMMUNITY): Payer: Medicare Other

## 2021-10-15 ENCOUNTER — Other Ambulatory Visit (HOSPITAL_COMMUNITY): Payer: Medicare Other

## 2021-10-15 ENCOUNTER — Ambulatory Visit: Payer: Medicare Other

## 2021-11-23 ENCOUNTER — Telehealth: Payer: Self-pay

## 2021-11-23 NOTE — Telephone Encounter (Signed)
Pt called requesting the instructions for her upcoming [place] appts.  Reviewed pt's chart, returned call for clarification, two identifiers used. Reviewed EVAR duplex instructions with pt. Confirmed understanding.

## 2021-11-24 NOTE — Progress Notes (Unsigned)
HISTORY AND PHYSICAL     CC:  follow up. For EVAR Requesting Provider:  Maris Berger, MD  HPI: This is a 71 y.o. female who is here today for follow up for AAA and is s/p EVAR on 07/05/2019 by Dr. Randie Heinz.  Pt was last seen June 2022 and at that time, she was not having any new abdominal or back pain.  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  It did not interfere with daily activity.  She also has hx of lumbar spine stenosis.  She was not having any rest pain or non healing wounds.    The pt returns today for follow up studies.   The pt is on a statin for cholesterol management.    The pt is on an aspirin.    Other AC:  Plavix The pt is on ACEI, BB for hypertension.  The pt does  have diabetes. Tobacco hx:  former  Pt does *** have family hx of AAA.  Past Medical History:  Diagnosis Date   AAA (abdominal aortic aneurysm) without rupture (HCC) 06/19/2019   Abdominal aortic aneurysm (HCC) 05/27/2017   Acquired hypothyroidism 07/06/2019   Allergy    Anemia    during pregnancy   Anxiety    hx   Aortic aneurysm of unspecified site, without rupture (HCC) 05/30/2017   Arthritis    Benign essential hypertension 05/30/2017   Benign hypertension 05/30/2017   CAD (coronary artery disease) 06/27/2019   Coronary artery calcification seen on CT scan    Depression    hx has meds as needed   Diverticulitis    Dyspnea    Hypothyroidism    Aquired   Mixed hyperlipidemia    Obesity (BMI 30-39.9) 06/19/2019   Pneumonia    in the past   Pre-diabetes    diet controlled, doesnt eat sweets   Prediabetes    Preoperative cardiovascular examination 06/01/2017   Shortness of breath 06/19/2019   Thyroid disease     Past Surgical History:  Procedure Laterality Date   ABDOMINAL AORTA STENT  07/05/2019   ABDOMINAL AORTIC ENDOVASCULAR STENT GRAFT (N/A Groin)    ABDOMINAL AORTIC ENDOVASCULAR STENT GRAFT  N/A 07/05/2019   Procedure: ABDOMINAL AORTIC ENDOVASCULAR STENT GRAFT;  Surgeon: [name], MD;  Location: [facility] OR;  Service: Vascular;  Laterality: N/A;   BACK SURGERY     khyphoplasty   CARPAL TUNNEL RELEASE     COLONOSCOPY     ENDARTERECTOMY FEMORAL  07/05/2019   Procedure: Right Endarterectomy Femoral;  Surgeon: [name], MD;  Location: [facility] OR;  Service: Vascular;;   EXCISION OF SKIN LESION     left arm   LEFT HEART CATH AND CORONARY ANGIOGRAPHY N/A 06/27/2019   Procedure: LEFT HEART CATH AND CORONARY ANGIOGRAPHY;  Surgeon: Lyn Records, MD;  Location: [facility] INVASIVE CV LAB;  Service: Cardiovascular;  Laterality: N/A;   TOE SURGERY     nails removed   TONSILLECTOMY      No Known Allergies  Current Outpatient Medications  Medication Sig Dispense Refill   albuterol (VENTOLIN HFA) 108 (90 Base) MCG/ACT inhaler Inhale into the lungs as needed for wheezing or shortness of breath.      alendronate (FOSAMAX) 70 MG tablet Take 70 mg by mouth every Wednesday. Take with a full glass of water on an empty stomach.     aspirin EC 81 MG  tablet Take 81 mg by mouth daily.     Calcium Carb-Cholecalciferol (CALCIUM 600/VITAMIN D3 PO) Take 1 tablet by mouth daily.     carvedilol (COREG) 6.25 MG tablet Take 6.25 mg by mouth 2 (two) times daily with a meal.      Cholecalciferol (VITAMIN D3) 50 MCG (2000 UT) TABS Take 2,000 Units by mouth every evening.     clopidogrel (PLAVIX) 75 MG tablet TAKE 1 TABLET BY MOUTH ONCE DAILY AT 6 AM 30 tablet 6   Fish Oil-Cholecalciferol (OMEGA-3 + VITAMIN D3 PO) Take 1 tablet by mouth daily at 12 noon.     fluticasone (FLONASE) 50 MCG/ACT nasal spray Place 1 spray into both nostrils daily as needed for rhinitis (runny nose/allergies.).     gabapentin (NEURONTIN) 300 MG capsule Take 300 mg by mouth 5 (five) times daily.      glucose blood test strip Accu-Chek Aviva Plus test strips  Use to check BS daily.     levothyroxine (SYNTHROID,  LEVOTHROID) 125 MCG tablet Take 125 mcg by mouth daily before breakfast.     lisinopril (PRINIVIL,ZESTRIL) 10 MG tablet Take 10 mg by mouth daily.     metFORMIN (GLUCOPHAGE) 500 MG tablet Take 500 mg by mouth daily.     Multiple Vitamins-Minerals (PRESERVISION AREDS 2 PO) Take 1 tablet by mouth in the morning and at bedtime.     rosuvastatin (CRESTOR) 40 MG tablet Take 1 tablet (40 mg total) by mouth daily. 90 tablet 3   No current facility-administered medications for this visit.    Family History  Problem Relation Age of Onset   Heart disease Mother    Diabetes Brother    Heart disease Brother    Colon cancer Sister     Social History   Socioeconomic History   Marital status: Widowed    Spouse name: Not on file   Number of children: Not on file   Years of education: Not on file   Highest education level: Not on file  Occupational History   Not on file  Tobacco Use   Smoking status: Former    Types: Cigarettes    Quit date: 03/30/2019    Years since quitting: 2.6   Smokeless tobacco: Never  Vaping Use   Vaping Use: Never used  Substance and Sexual Activity   Alcohol use: No   Drug use: No   Sexual activity: Not Currently  Other Topics Concern   Not on file  Social History Narrative   Not on file   Social Determinants of Health   Financial Resource Strain: Not on file  Food Insecurity: Not on file  Transportation Needs: Not on file  Physical Activity: Not on file  Stress: Not on file  Social Connections: Not on file  Intimate Partner Violence: Not on file     REVIEW OF SYSTEMS:  *** [X]  denotes positive finding, [ ]  denotes negative finding Cardiac  Comments:  Chest pain or chest pressure:    Shortness of breath upon exertion:    Short of breath when lying flat:    Irregular heart rhythm:        Vascular    Pain in calf, thigh, or hip brought on by ambulation:    Pain in feet at night that wakes you up from your sleep:     Blood clot in your veins:     Leg swelling:         Pulmonary    Oxygen at home:  Productive cough:     Wheezing:         Neurologic    Sudden weakness in arms or legs:     Sudden numbness in arms or legs:     Sudden onset of difficulty speaking or slurred speech:    Temporary loss of vision in one eye:     Problems with dizziness:         Gastrointestinal    Blood in stool:     Vomited blood:         Genitourinary    Burning when urinating:     Blood in urine:        Psychiatric    Major depression:         Hematologic    Bleeding problems:    Problems with blood clotting too easily:        Skin    Rashes or ulcers:        Constitutional    Fever or chills:      PHYSICAL EXAMINATION:  ***  General:  WDWN in NAD; vital signs documented above Gait: Not observed HENT: WNL, normocephalic Pulmonary: normal non-labored breathing , without wheezing Cardiac: {Desc; regular/irreg:14544} HR,  {With/Without:20273} carotid bruit*** Abdomen: soft, NT, no masses; aortic pulse is *** palpable Skin: {With/Without:20273} rashes Vascular Exam/Pulses:  Right Left  Radial {Exam; arterial pulse strength 0-4:30167} {Exam; arterial pulse strength 0-4:30167}  Femoral {Exam; arterial pulse strength 0-4:30167} {Exam; arterial pulse strength 0-4:30167}  Popliteal {Exam; arterial pulse strength 0-4:30167} {Exam; arterial pulse strength 0-4:30167}  DP {Exam; arterial pulse strength 0-4:30167} {Exam; arterial pulse strength 0-4:30167}  PT {Exam; arterial pulse strength 0-4:30167} {Exam; arterial pulse strength 0-4:30167}   Extremities: {With/Without:20273} ischemic changes, {With/Without:20273} Gangrene , {With/Without:20273} cellulitis; {With/Without:20273} open wounds Musculoskeletal: no muscle wasting or atrophy  Neurologic: A&O X 3;  No focal weakness or paresthesias are detected Psychiatric:  The pt has {Desc; normal/abnormal:11317::"Normal"} affect.   Non-Invasive Vascular Imaging:   EVAR Arterial  duplex on 11/26/2021: ***  Previous EVAR arterial duplex on 09/15/2020: Summary:  Abdominal Aorta: Previous diameter measurement was 5.6 cm obtained on 06/15/2019. The largest diameter visualized today (post surgery) is 4.40 x 4.80.   ASSESSMENT/PLAN:: 71 y.o. female here with hx of EVAR on 07/05/2019 by Dr. Randie Heinz.  -*** -continue *** -pt will f/u in *** with ***.   Doreatha Massed, [facility] [phone]  Clinic MD:   Edilia Bo

## 2021-11-26 ENCOUNTER — Ambulatory Visit (INDEPENDENT_AMBULATORY_CARE_PROVIDER_SITE_OTHER)
Admission: RE | Admit: 2021-11-26 | Discharge: 2021-11-26 | Disposition: A | Discharge status: Home / Self Care | Payer: Medicare Other | Source: Ambulatory Visit | Attending: Vascular Surgery | Admitting: Vascular Surgery

## 2021-11-26 ENCOUNTER — Ambulatory Visit (INDEPENDENT_AMBULATORY_CARE_PROVIDER_SITE_OTHER): Payer: Medicare Other | Admitting: Physician Assistant

## 2021-11-26 ENCOUNTER — Ambulatory Visit (HOSPITAL_COMMUNITY)
Admission: RE | Admit: 2021-11-26 | Discharge: 2021-11-26 | Disposition: A | Discharge status: Home / Self Care | Payer: Medicare Other | Source: Ambulatory Visit | Attending: Vascular Surgery | Admitting: Vascular Surgery

## 2021-11-26 VITALS — BP 170/80 | HR 74 | Temp 97.7°F | Resp 20 | Ht 64.5 in | Wt 198.1 lb

## 2021-11-26 DIAGNOSIS — I70212 Atherosclerosis of native arteries of extremities with intermittent claudication, left leg: Secondary | ICD-10-CM

## 2021-11-26 DIAGNOSIS — I714 Abdominal aortic aneurysm, without rupture, unspecified: Secondary | ICD-10-CM

## 2021-11-26 DIAGNOSIS — I739 Peripheral vascular disease, unspecified: Secondary | ICD-10-CM

## 2021-12-06 NOTE — Progress Notes (Unsigned)
Office Visit Note  Patient: Cathy Haney             Date of Birth: 1950/08/09           MRN: [id number]             PCP: Maris Berger, MD Referring: Maris Berger, MD Visit Date: 12/07/2021 Occupation: @GUAROCC @  Subjective:  No chief complaint on file.   History of Present Illness: Cathy Haney is a 71 y.o. female ***   Activities of Daily Living:  Patient reports morning stiffness for *** {minute/hour:19697}.   Patient {ACTIONS;DENIES/REPORTS:21021675::"Denies"} nocturnal pain.  Difficulty dressing/grooming: {ACTIONS;DENIES/REPORTS:21021675::"Denies"} Difficulty climbing stairs: {ACTIONS;DENIES/REPORTS:21021675::"Denies"} Difficulty getting out of chair: {ACTIONS;DENIES/REPORTS:21021675::"Denies"} Difficulty using hands for taps, buttons, cutlery, and/or writing: {ACTIONS;DENIES/REPORTS:21021675::"Denies"}  No Rheumatology ROS completed.   PMFS History:  Patient Active Problem List   Diagnosis Date Noted   Pre-diabetes    Pneumonia    Hypothyroidism    Dyspnea    Depression    Arthritis    Anxiety    Anemia    Allergy    Acquired hypothyroidism 07/06/2019   CAD (coronary artery disease) 06/27/2019   Coronary artery calcification seen on CT scan    Shortness of breath 06/19/2019   Mixed hyperlipidemia 06/19/2019   Prediabetes 06/19/2019   Obesity (BMI 30-39.9) 06/19/2019   AAA (abdominal aortic aneurysm) without rupture (HCC) 06/19/2019   Preoperative cardiovascular examination 06/01/2017   Diverticulitis 05/30/2017   Thyroid disease 05/30/2017   Benign hypertension 05/30/2017   Aortic aneurysm of unspecified site, without rupture (HCC) 05/30/2017   Benign essential hypertension 05/30/2017   Abdominal aortic aneurysm (HCC) 05/27/2017    Past Medical History:  Diagnosis Date   AAA (abdominal aortic aneurysm) without rupture (HCC) 06/19/2019   Abdominal aortic aneurysm (HCC) 05/27/2017   Acquired hypothyroidism 07/06/2019   Allergy    Anemia    during  pregnancy   Anxiety    hx   Aortic aneurysm of unspecified site, without rupture (HCC) 05/30/2017   Arthritis    Benign essential hypertension 05/30/2017   Benign hypertension 05/30/2017   CAD (coronary artery disease) 06/27/2019   Coronary artery calcification seen on CT scan    Depression    hx has meds as needed   Diverticulitis    Dyspnea    Hypothyroidism    Aquired   Mixed hyperlipidemia    Obesity (BMI 30-39.9) 06/19/2019   Pneumonia    in the past   Pre-diabetes    diet controlled, doesnt eat sweets   Prediabetes    Preoperative cardiovascular examination 06/01/2017   Shortness of breath 06/19/2019   Thyroid disease     Family History  Problem Relation Age of Onset   Heart disease Mother    Diabetes Brother    Heart disease Brother    Colon cancer Sister    Past Surgical History:  Procedure Laterality Date   ABDOMINAL AORTA STENT  07/05/2019   ABDOMINAL AORTIC ENDOVASCULAR STENT GRAFT (N/A Groin)    ABDOMINAL AORTIC ENDOVASCULAR STENT GRAFT N/A 07/05/2019   Procedure: ABDOMINAL AORTIC ENDOVASCULAR STENT GRAFT;  Surgeon: 09/04/2019, MD;  Location: [facility] OR;  Service: Vascular;  Laterality: N/A;   BACK SURGERY     khyphoplasty   CARPAL TUNNEL RELEASE     COLONOSCOPY     ENDARTERECTOMY FEMORAL  07/05/2019   Procedure: Right Endarterectomy Femoral;  Surgeon: 09/04/2019, MD;  Location: [facility] OR;  Service: Vascular;;   EXCISION OF SKIN LESION  left arm   LEFT HEART CATH AND CORONARY ANGIOGRAPHY N/A 06/27/2019   Procedure: LEFT HEART CATH AND CORONARY ANGIOGRAPHY;  Surgeon: Belva Crome, MD;  Location: [facility];  Service: Cardiovascular;  Laterality: N/A;   TOE SURGERY     nails removed   TONSILLECTOMY     Social History   Social History Narrative   Not on file    There is no immunization history on file for this patient.   Objective: Vital Signs: There were no vitals taken for this visit.   Physical Exam   Musculoskeletal  Exam: ***  CDAI Exam: CDAI Score: -- Patient Global: --; Provider Global: -- Swollen: --; Tender: -- Joint Exam 12/07/2021   No joint exam has been documented for this visit   There is currently no information documented on the homunculus. Go to the Rheumatology activity and complete the homunculus joint exam.  Investigation: No additional findings.  Imaging: VAS [place] EVAR DUPLEX  Result Date: 11/26/2021 ABDOMINAL AORTA STUDY Patient Name:  Cathy Haney  Date of Exam:   11/26/2021 Medical Rec #: [id number]     Accession #:    [id number] Date of Birth: 1950-10-28     Patient Gender: F Patient Age:   58 years Exam Location:  [facility] Procedure:      VAS [place] EVAR DUPLEX Referring Phys: Servando Snare --------------------------------------------------------------------------------  Indications: Follow up exam for EVAR. Surgery date 07/05/19. Risk Factors: Diabetes, past history of smoking.  Comparison Study: 09/15/20: 4.4 x 4.8 cm Performing Technologist: Ralene Cork RVT  Examination Guidelines: A complete evaluation includes B-mode imaging, spectral Doppler, color Doppler, and power Doppler as needed of all accessible portions of each vessel. Bilateral testing is considered an integral part of a complete examination. Limited examinations for reoccurring indications may be performed as noted.  Abdominal Aorta Findings: +----------+-------+----------+----------+----------+--------+--------+ Location  AP (cm)Trans (cm)PSV (cm/s)Waveform  ThrombusComments +----------+-------+----------+----------+----------+--------+--------+ RT EIA Mid                 200       biphasic                   +----------+-------+----------+----------+----------+--------+--------+ LT EIA Mid                 171       monophasic                 +----------+-------+----------+----------+----------+--------+--------+ Endovascular Aortic Repair (EVAR):  +----------+----------------+-------------------+-------------------+           Diameter AP (cm)Diameter Trans (cm)Velocities (cm/sec) +----------+----------------+-------------------+-------------------+ Aorta     3.40            3.56               53                  +----------+----------------+-------------------+-------------------+ Right Limb1.23            1.35               74                  +----------+----------------+-------------------+-------------------+ Left Limb 1.21            1.25               47                  +----------+----------------+-------------------+-------------------+  Summary: Abdominal Aorta: Patent endovascular aneurysm repair with no evidence  of endoleak. See above for comparison.  *See table(s) above for measurements and observations.  Electronically signed by Deitra Mayo MD on 11/26/2021 at 10:43:11 AM.    Final    VAS [place] ABI WITH/WO TBI  Result Date: 11/26/2021  LOWER EXTREMITY DOPPLER STUDY Patient Name:  JOYCELIN MCNATT  Date of Exam:   11/26/2021 Medical Rec #: [id number]     Accession #:    [id number] Date of Birth: [birth year]     Patient Gender: F Patient Age:   61 years Exam Location:  [facility] Procedure:      VAS [place] ABI WITH/WO TBI Referring Phys: Servando Snare --------------------------------------------------------------------------------  Indications: Peripheral artery disease. High Risk Factors: Diabetes, past history of smoking. Other Factors: K[place]w left SFA occlusion.  Performing Technologist: Ralene Cork RVT  Examination Guidelines: A complete evaluation includes at minimum, Doppler waveform signals and systolic blood pressure reading at the level of bilateral brachial, anterior tibial, and posterior tibial arteries, when vessel segments are accessible. Bilateral testing is considered an integral part of a complete examination. Photoelectric Plethysmograph (PPG) waveforms and toe systolic pressure readings are  included as required and additional duplex testing as needed. Limited examinations for reoccurring indications may be performed as noted.  ABI Findings: +---------+------------------+-----+----------+--------+ Right    Rt Pressure (mmHg)IndexWaveform  Comment  +---------+------------------+-----+----------+--------+ Brachial 151                                       +---------+------------------+-----+----------+--------+ PTA      139               0.92 monophasic         +---------+------------------+-----+----------+--------+ DP       138               0.91 triphasic          +---------+------------------+-----+----------+--------+ Great Toe60                0.40                    +---------+------------------+-----+----------+--------+ +---------+------------------+-----+----------+-------+ Left     Lt Pressure (mmHg)IndexWaveform  Comment +---------+------------------+-----+----------+-------+ Brachial 148                                      +---------+------------------+-----+----------+-------+ PTA      86                0.57 monophasic        +---------+------------------+-----+----------+-------+ DP       73                0.48 monophasic        +---------+------------------+-----+----------+-------+ Great Toe39                0.26                   +---------+------------------+-----+----------+-------+ +-------+-----------+-----------+------------+------------+ ABI/TBIToday's ABIToday's TBIPrevious ABIPrevious TBI +-------+-----------+-----------+------------+------------+ Right  0.92       0.4                                 +-------+-----------+-----------+------------+------------+ Left   0.57       0.26                                +-------+-----------+-----------+------------+------------+  No previous ABI.  Summary: Right: Resting right ankle-brachial index indicates mild right lower extremity arterial disease. The  right toe-brachial index is abnormal. Left: Resting left ankle-brachial index indicates moderate left lower extremity arterial disease. The left toe-brachial index is abnormal. *See table(s) above for measurements and observations.  Electronically signed by Waverly Ferrari MD on 11/26/2021 at 10:42:33 AM.    Final     Recent Labs: Lab Results  Component Value Date   WBC 14.0 (H) 07/06/2019   HGB 9.1 (L) 07/06/2019   PLT 208 07/06/2019   NA 135 07/06/2019   K 4.2 07/06/2019   CL 103 07/06/2019   CO2 21 (L) 07/06/2019   GLUCOSE 164 (H) 07/06/2019   BUN 21 07/06/2019   CREATININE 0.92 07/06/2019   BILITOT 0.6 07/03/2019   ALKPHOS 51 07/03/2019   AST 23 07/03/2019   ALT 19 07/03/2019   PROT 7.4 07/03/2019   ALBUMIN 4.0 07/03/2019   CALCIUM 8.9 07/06/2019   GFRAA >60 07/06/2019    Speciality Comments: No specialty comments available.  Procedures:  No procedures performed Allergies: Patient has no known allergies.   Assessment / Plan:     Visit Diagnoses: No diagnosis found.  Orders: No orders of the defined types were placed in this encounter.  No orders of the defined types were placed in this encounter.   Face-to-face time spent with patient was *** minutes. Greater than 50% of time was spent in counseling and coordination of care.  Follow-Up Instructions: No follow-ups on file.   Fuller Plan, MD  Note - This record has been created using [employer].  Chart creation errors have been sought, but may not always  have been located. Such creation errors do not reflect on  the standard of medical care.

## 2021-12-07 ENCOUNTER — Telehealth: Payer: Self-pay | Admitting: Pharmacist

## 2021-12-07 ENCOUNTER — Ambulatory Visit: Payer: Medicare Other | Attending: Internal Medicine | Admitting: Internal Medicine

## 2021-12-07 ENCOUNTER — Encounter: Payer: Self-pay | Admitting: Internal Medicine

## 2021-12-07 VITALS — BP 140/83 | HR 73 | Resp 15 | Ht 64.0 in | Wt 198.4 lb

## 2021-12-07 DIAGNOSIS — E079 Disorder of thyroid, unspecified: Secondary | ICD-10-CM

## 2021-12-07 DIAGNOSIS — M810 Age-related osteoporosis without current pathological fracture: Secondary | ICD-10-CM

## 2021-12-07 DIAGNOSIS — E559 Vitamin D deficiency, unspecified: Secondary | ICD-10-CM

## 2021-12-07 HISTORY — DX: Vitamin D deficiency, unspecified: E55.9

## 2021-12-07 HISTORY — DX: Age-related osteoporosis without current pathological fracture: M81.0

## 2021-12-07 NOTE — Progress Notes (Signed)
Pharmacy Note  Subjective:  Patient presents today to [facility] for initlal office visit.   Patient was seen by the pharmacist for counseling on Prolia. She currently takes alendronate weekly. Has had decline in bone density  Objective: CMP     Component Value Date/Time   NA 135 07/06/2019 0434   NA 137 06/22/2019 0942   K 4.2 07/06/2019 0434   CL 103 07/06/2019 0434   CO2 21 (L) 07/06/2019 0434   GLUCOSE 164 (H) 07/06/2019 0434   BUN 21 07/06/2019 0434   BUN 13 06/22/2019 0942   CREATININE 0.92 07/06/2019 0434   CALCIUM 8.9 07/06/2019 0434   PROT 7.4 07/03/2019 1135   ALBUMIN 4.0 07/03/2019 1135   AST 23 07/03/2019 1135   ALT 19 07/03/2019 1135   ALKPHOS 51 07/03/2019 1135   BILITOT 0.6 07/03/2019 1135   GFRNONAA >60 07/06/2019 0434   GFRAA >60 07/06/2019 0434   DEXA reviewed  DualFemur Total Left 06/11/21            -1.7      0.798 DualFemur Mean 06/11/21            -1.4      0.837 Left forearm Radius 06/11/21            -3.1      0.606  Assessment/Plan:  Counseled patient on purpose, proper use, and adverse effects of Prolia.  Counseled patient that Prolia is a medication that must be injected every 6 months by a healthcare professional.  Advised patient to take calcium 1200 mg daily and vitamin D 800 units daily.  She takes Vitamin D 2000 IU daily. Reviewed the most common adverse effects of Prolia including risk of infection, osteonecrosis of the jaw, rash, and muscle/bone pain.  Patient confirms she does not have any major dental work planned at this time.  Reviewed with patient the signs/symptoms of low calcium and advised patient to alert [place] if she experiences these symptoms.  Provided patient with medication education material and answered all questions. Will apply for Prolia through patient's insurance.   Patient is having dental crown placed this year but is unsure. She will call her dentist to see if Prolia injection needs to be delayed.  Chesley Mires, PharmD, MPH, BCPS, CPP Clinical Pharmacist (Rheumatology and Pulmonology)

## 2021-12-07 NOTE — Telephone Encounter (Signed)
Patient is new start to Prolia.  Please start Prolia BIV through medical benefit AND pharmacy benefit  Prolia - [id number], [phone]  Dose: 60mg  SQ every 6 months  Dx: Age-related osteoporosis (M80.0)  Previously tried therapies: Alendronate - continued bone density deterioration  , PharmD, MPH, BCPS, CPP Clinical Pharmacist (Rheumatology and Pulmonology)

## 2021-12-08 LAB — BASIC METABOLIC PANEL WITHOUT GFR
BUN: 16 mg/dL (ref 7–25)
CO2: 23 mmol/L (ref 20–32)
Calcium: 10.2 mg/dL (ref 8.6–10.4)
Chloride: 105 mmol/L (ref 98–110)
Creat: 0.92 mg/dL (ref 0.60–1.00)
Glucose, Bld: 137 mg/dL (ref 65–139)
Potassium: 4.9 mmol/L (ref 3.5–5.3)
Sodium: 141 mmol/L (ref 135–146)
eGFR: 66 mL/min/{1.73_m2}

## 2021-12-08 LAB — VITAMIN D 25 HYDROXY (VIT D DEFICIENCY, FRACTURES): Vit D, 25-Hydroxy: 63 ng/mL (ref 30–100)

## 2021-12-08 LAB — TSH: TSH: 1.4 m[IU]/L (ref 0.40–4.50)

## 2021-12-08 LAB — PARATHYROID HORMONE, INTACT (NO CA): PTH: 21 pg/mL (ref 16–77)

## 2021-12-11 ENCOUNTER — Other Ambulatory Visit (HOSPITAL_COMMUNITY): Payer: Self-pay

## 2021-12-11 NOTE — Telephone Encounter (Signed)
Per test claim through pharmacy benefit, patient has no copay for Prolia.  Called provider services for [facility] to inquire about medical benefits for [phone], [phone]. Patient is active as of 03/29/21. No deductible and OOP max is $8300. Medicare covers 80% of the cost of medication. Patient is responsible for 20% of the cost of the injection after Part B deductible is met.  Call reference # [id number]  Phone: [phone]  Receiving in clinic will be most cost-effective option for patient  Knox Saliva, PharmD, MPH, BCPS, CPP Clinical Pharmacist (Rheumatology and Pulmonology)

## 2021-12-14 ENCOUNTER — Other Ambulatory Visit (HOSPITAL_COMMUNITY): Payer: Self-pay

## 2021-12-14 MED ORDER — DENOSUMAB 60 MG/ML ~~LOC~~ SOSY
60.0000 mg | PREFILLED_SYRINGE | Freq: Once | SUBCUTANEOUS | 0 refills | Status: DC
  Filled 2021-12-14: qty 1, 1d supply, fill #0
  Filled 2021-12-15: qty 1, 180d supply, fill #0

## 2021-12-14 NOTE — Telephone Encounter (Signed)
Labs from 12/07/21 wnl. Patient advised that labs wnl  I reviewed Prolia benefits findings with patent. Patient states she would like to receive Prolia at the clinic  Rx for Prolia sent to [facility] to be couriered to clinic by appt on 12/22/21.  Knox Saliva, PharmD, MPH, BCPS, CPP Clinical Pharmacist (Rheumatology and Pulmonology)

## 2021-12-15 ENCOUNTER — Other Ambulatory Visit (HOSPITAL_COMMUNITY): Payer: Self-pay

## 2021-12-17 NOTE — Telephone Encounter (Signed)
Prolia received from [facility]. Placed in refrigerator  Andee Chivers, PharmD, MPH, BCPS, CPP Clinical Pharmacist (Rheumatology and Pulmonology) 

## 2021-12-21 NOTE — Patient Instructions (Signed)
Please repeat labs in 10-14 days (between 12/02/21 and 12/06/21) so that we can check your calcium level. CALL us and we will send lab orders closer to your home if you'd like.  Your next Prolia will be due on ***  We will call you several weeks before this date to ensure labs are updated  MONITOR for signs/symptoms of low CALCIUM Hypocalcemia, Adult Hypocalcemia is when the level of calcium in a person's blood is below normal. Calcium is a mineral that is used by the body in many ways. Not having enough blood calcium can affect the nervous system. This can lead to problems with the muscles, the heart, and the brain. What are the causes? This condition may be caused by: A deficiency of vitamin D or magnesium or both. Decreased levels of parathyroid hormone (hypoparathyroidism). Kidney function problems. Low levels of a body protein called albumin. Inflammation of the pancreas (pancreatitis). Not taking in enough vitamins and minerals in the diet or having intestinal problems that interfere with absorption of nutrients. Certain medicines. What are the signs or symptoms? Symptoms of this condition may include: Numbness and tingling in the fingers, toes, or around the mouth. Muscle twitching, aches, or cramps, especially in the legs, feet, and back. Spasm of the voice box. This may make it difficult to breathe or speak. Fast heartbeats (palpitations) and abnormal heart rhythms (dysrhythmias). Shaking uncontrollably (seizures). Memory problems, confusion, or difficulty thinking. Depression, anxiety, irritability, or changes in personality. Long-term (chronic) symptoms of this condition may include: Coarse, brittle hair and nails. Dry skin or lasting skin diseases. These include psoriasis, eczema, or dermatitis. Dental cavities. Clouding of the eye lens (cataracts). Some people may not have any symptoms, especially if they have chronic hypocalcemia. How is this diagnosed?  This condition  is usually diagnosed with a blood test. You may also have other tests to help determine the underlying cause of the condition. This may include more blood tests and imaging tests. How is this treated? This condition may be treated with: Calcium given by mouth or through an IV. The method used for giving calcium will depend on the severity of the condition. If your condition is severe, you may need to be closely monitored in the hospital. Giving other minerals (electrolytes), such as magnesium. Other treatment will depend on the cause of the condition. Follow these instructions at home: Follow diet instructions from your health care provider or dietitian. Take supplements only as told by your health care provider. Do not take an iron supplement within 2 hours of taking a calcium supplement. Keep all follow-up visits. This is important. Contact a health care provider if: You have increased muscle twitching or cramps. You have new swelling in the feet, ankles, or legs. You develop changes in mood, memory, or personality. Get help right away if: You have chest pain. You have persistent rapid or irregular heartbeats. You have trouble breathing. You faint. You start to have seizures. You have confusion. These symptoms may represent a serious problem that is an emergency. Do not wait to see if the symptoms will go away. Get medical help right away. Call your local emergency services (911 in the [place] Do not drive yourself to the hospital. Summary Hypocalcemia is when the level of calcium in a person's blood is below normal. Not having enough blood calcium can affect the nervous system. This condition may be treated with calcium given by mouth or through an IV, or by receiving other minerals. Other treatment depends on the  cause of the condition. Take supplements only as told by your health care provider. Contact a health care provider if you have new or worsening symptoms. Keep all follow-up  visits. This is important. This information is not intended to replace advice given to you by your health care provider. Make sure you discuss any questions you have with your health care provider. Document Revised: 08/20/2020 Document Reviewed: 08/20/2020 Elsevier Patient Education  2023 [employer].

## 2021-12-21 NOTE — Progress Notes (Unsigned)
Pharmacy Note  Subjective:   Patient presents to clinic today to start Prolia for age-related osteoporosis. She is transitioning from alendronate treatment.  Patient running a fever or have signs/symptoms of infection? {yes/no:20286}  Patient currently on antibiotics for the treatment of infection? {yes/no:20286}  Patient had fall in the last 6 months?  {yes/no:20286}  If yes, did it require medical attention? {yes/no:20286}   Patient taking calcium 1200 mg daily through diet or supplement and at least 800 units vitamin D? {yes/no:20286}  Objective: CMP     Component Value Date/Time   NA 141 12/07/2021 0947   NA 137 06/22/2019 0942   K 4.9 12/07/2021 0947   CL 105 12/07/2021 0947   CO2 23 12/07/2021 0947   GLUCOSE 137 12/07/2021 0947   BUN 16 12/07/2021 0947   BUN 13 06/22/2019 0942   CREATININE 0.92 12/07/2021 0947   CALCIUM 10.2 12/07/2021 0947   PROT 7.4 07/03/2019 1135   ALBUMIN 4.0 07/03/2019 1135   AST 23 07/03/2019 1135   ALT 19 07/03/2019 1135   ALKPHOS 51 07/03/2019 1135   BILITOT 0.6 07/03/2019 1135   GFRNONAA >60 07/06/2019 0434   GFRAA >60 07/06/2019 0434    CBC    Component Value Date/Time   WBC 14.0 (H) 07/06/2019 0434   RBC 2.96 (L) 07/06/2019 0434   HGB 9.1 (L) 07/06/2019 0434   HGB 12.1 06/22/2019 0942   HCT 27.5 (L) 07/06/2019 0434   HCT 36.5 06/22/2019 0942   PLT 208 07/06/2019 0434   PLT 227 06/22/2019 0942   MCV 92.9 07/06/2019 0434   MCV 94 06/22/2019 0942   MCH 30.7 07/06/2019 0434   MCHC 33.1 07/06/2019 0434   RDW 13.3 07/06/2019 0434   RDW 13.2 06/22/2019 0942    Lab Results  Component Value Date   VD25OH 63 12/07/2021    DEXA reviewed   DualFemur Total Left 06/11/21            -1.7      0.798 DualFemur Mean 06/11/21            -1.4      0.837 Left forearm Radius 06/11/21            -3.1      0.606  Assessment/Plan:   Reviewed importance of adequate dietary intake of calcium in addition to supplementation due to risk of  hypocalcemia with Prolia.   Patient tolerated injection  ***.   Patient is to return in 10-14 days for labs to monitor for hypocalcemia.  Future orders placed.  Patient's next Prolia dose is due on ***.  Patient is due for updated DEXA in March 2025.   All questions encouraged and answered.  Instructed patient to call with any further questions or concerns.  Knox Saliva, PharmD, MPH, BCPS, CPP Clinical Pharmacist (Rheumatology and Pulmonology)

## 2021-12-22 ENCOUNTER — Ambulatory Visit: Payer: Medicare Other | Attending: Internal Medicine | Admitting: Pharmacist

## 2021-12-22 DIAGNOSIS — M810 Age-related osteoporosis without current pathological fracture: Secondary | ICD-10-CM | POA: Diagnosis not present

## 2021-12-22 DIAGNOSIS — Z79899 Other long term (current) drug therapy: Secondary | ICD-10-CM

## 2021-12-22 DIAGNOSIS — Z7689 Persons encountering health services in other specified circumstances: Secondary | ICD-10-CM

## 2021-12-22 MED ORDER — DENOSUMAB 60 MG/ML ~~LOC~~ SOSY
60.0000 mg | PREFILLED_SYRINGE | Freq: Once | SUBCUTANEOUS | Status: AC
  Administered 2021-12-22: 60 mg via SUBCUTANEOUS

## 2022-01-04 ENCOUNTER — Telehealth: Payer: Self-pay | Admitting: Internal Medicine

## 2022-01-04 NOTE — Telephone Encounter (Signed)
Labs from 12/07/2021 faxed to PCP. Called and confirmed that PCP is [facility]. Patient verified.

## 2022-01-04 NOTE — Telephone Encounter (Signed)
Patient called requesting labwork Dr. Benjamine Mola needs be sent to her PCP Dr. Gala Lewandowsky.  Patient plans to go this morning.

## 2022-01-06 ENCOUNTER — Other Ambulatory Visit: Payer: Self-pay | Admitting: *Deleted

## 2022-01-06 DIAGNOSIS — M810 Age-related osteoporosis without current pathological fracture: Secondary | ICD-10-CM

## 2022-01-06 DIAGNOSIS — Z79899 Other long term (current) drug therapy: Secondary | ICD-10-CM

## 2022-01-07 LAB — COMPREHENSIVE METABOLIC PANEL
AG Ratio: 1.5 (calc) (ref 1.0–2.5)
ALT: 9 U/L (ref 6–29)
AST: 14 U/L (ref 10–35)
Albumin: 4.4 g/dL (ref 3.6–5.1)
Alkaline phosphatase (APISO): 54 U/L (ref 37–153)
BUN: 21 mg/dL (ref 7–25)
CO2: 26 mmol/L (ref 20–32)
Calcium: 9.7 mg/dL (ref 8.6–10.4)
Chloride: 105 mmol/L (ref 98–110)
Creat: 0.72 mg/dL (ref 0.60–1.00)
Globulin: 3 g/dL (calc) (ref 1.9–3.7)
Glucose, Bld: 128 mg/dL — ABNORMAL HIGH (ref 65–99)
Potassium: 4.9 mmol/L (ref 3.5–5.3)
Sodium: 139 mmol/L (ref 135–146)
Total Bilirubin: 0.5 mg/dL (ref 0.2–1.2)
Total Protein: 7.4 g/dL (ref 6.1–8.1)

## 2022-01-07 NOTE — Progress Notes (Signed)
Lab result looks completely fine no changes in kidney function or calcium with current treatment.

## 2022-04-22 ENCOUNTER — Telehealth: Payer: Self-pay | Admitting: Internal Medicine

## 2022-04-22 NOTE — Telephone Encounter (Signed)
We should not need any extra labs at this time. Recommendation is to check calcium and renal function at least once after each dose, so I do not need repeat until after her first dose of this year.

## 2022-04-22 NOTE — Telephone Encounter (Signed)
Patient called stating she has an appointment scheduled with Dr. Spero Curb on 04/28/22 and requested any labwork that Dr. Benjamine Mola needs be sent to their office.

## 2022-04-22 NOTE — Telephone Encounter (Signed)
Advised patient that per Dr. Benjamine Mola, We should not need any extra labs at this time. Recommendation is to check calcium and renal function at least once after each dose, so I do not need repeat until after her first dose of this year. Patient verbalized understanding.

## 2022-04-22 NOTE — Telephone Encounter (Signed)
Patient is currently on prolia and last injection was 12/22/2021.  Labs: 01/06/2022 CMP: Lab result looks completely fine no changes in kidney function or calcium with current treatment.   Would you like any labs drawn on 04/28/2022 through patient's PCP?

## 2022-04-23 ENCOUNTER — Other Ambulatory Visit (HOSPITAL_COMMUNITY): Payer: Self-pay

## 2022-04-24 NOTE — Progress Notes (Unsigned)
Cardiology Office Note:    Date:  04/26/2022   ID:  Cathy BRIERE, DOB 06-06-1950, MRN [id number]  PCP:  Cathy Lewandowsky, MD  Cardiologist:  Shirlee More, MD    Referring MD: Cathy Lewandowsky, MD    ASSESSMENT:    1. Coronary artery disease involving native coronary artery of native heart without angina pectoris   2. Mixed hyperlipidemia   3. Benign hypertension    PLAN:    In order of problems listed above:  Cathy Haney continues to do well with CAD New York Heart Association class I having no anginal discomfort she will continue her long-term dual antiplatelet therapy beta-blocker and high intensity statin.  If she were to have a change in clinical pattern coronary angiography and consideration of CABG would be appropriate. Continue high intensity statin requested lipids per PCP office Continue current treatment including ACE inhibitor BP at target   Next appointment: 1 year   Medication Adjustments/Labs and Tests Ordered: Current medicines are reviewed at length with the patient today.  Concerns regarding medicines are outlined above.  No orders of the defined types were placed in this encounter.  No orders of the defined types were placed in this encounter.   Chief Complaint  Patient presents with   Follow-up   Coronary Artery Disease    History of Present Illness:    Cathy Haney is a 72 y.o. female with a hx of CAD abdominal aortic aneurysm with EVAR April 2021 hypertension hyperlipidemia and hypothyroidism last seen 07/24/2021.  06/27/2019: Procedures   LEFT HEART CATH AND CORONARY ANGIOGRAPHY    Conclusion   Heavy three-vessel coronary calcification. Proximal LAD saccular aneurysm.  Moderate diffuse LAD disease with 60 to 70% distal eccentric stenosis. Ostial eccentric 50% circumflex followed by proximal eccentric 60 to 70% stenosis. Moderate to severe diffuse right coronary disease, tortuosity, including 50% proximal stenosis and 60 to 70% mid to distal  stenosis. Normal LV systolic function with EF 65%.  EDP is normal.   Coronary Diagrams   Diagnostic Dominance: Right         Compliance with diet, lifestyle and medications: Yes  She continues to do well tolerates her current medical therapy including long-term dual antiplatelet therapy high intensity statin without side effects muscle pain or weakness Or limitation of chronic back pain she is not having claudication edema shortness of breath palpitation or syncope and has had no chest discomfort or angina. Recent labs 01/06/2022 creatinine 0.72 potassium 4.9 Lipids are followed by her PCP office I cannot see the results in Care Everywhere requested Past Medical History:  Diagnosis Date   AAA (abdominal aortic aneurysm) without rupture ([place]) 06/19/2019   Abdominal aortic aneurysm ([place]) 05/27/2017   Acquired hypothyroidism 07/06/2019   Allergy    Anemia    during pregnancy   Anxiety    hx   Aortic aneurysm of unspecified site, without rupture ([place]) 05/30/2017   Arthritis    Benign essential hypertension 05/30/2017   Benign hypertension 05/30/2017   CAD (coronary artery disease) 06/27/2019   Coronary artery calcification seen on CT scan    Depression    hx has meds as needed   Diverticulitis    Dyspnea    Hypothyroidism    Aquired   Mixed hyperlipidemia    Obesity (BMI 30-39.9) 06/19/2019   Pneumonia    in the past   Pre-diabetes    diet controlled, doesnt eat sweets   Prediabetes    Preoperative cardiovascular examination 06/01/2017   Shortness  of breath 06/19/2019   Thyroid disease     Past Surgical History:  Procedure Laterality Date   ABDOMINAL AORTA STENT  07/05/2019   ABDOMINAL AORTIC ENDOVASCULAR STENT GRAFT (N/A Groin)    ABDOMINAL AORTIC ENDOVASCULAR STENT GRAFT N/A 07/05/2019   Procedure: ABDOMINAL AORTIC ENDOVASCULAR STENT GRAFT;  Surgeon: Cathy Harman, MD;  Location: [facility] OR;  Service: Vascular;  Laterality: N/A;   BACK SURGERY     khyphoplasty   CARPAL  TUNNEL RELEASE     COLONOSCOPY     ENDARTERECTOMY FEMORAL  07/05/2019   Procedure: Right Endarterectomy Femoral;  Surgeon: Cathy Harman, MD;  Location: [facility] OR;  Service: Vascular;;   EXCISION OF SKIN LESION     left arm   LEFT HEART CATH AND CORONARY ANGIOGRAPHY N/A 06/27/2019   Procedure: LEFT HEART CATH AND CORONARY ANGIOGRAPHY;  Surgeon: Lyn Records, MD;  Location: [facility] INVASIVE CV LAB;  Service: Cardiovascular;  Laterality: N/A;   TOE SURGERY     nails removed   TONSILLECTOMY      Current Medications: Current Meds  Medication Sig   albuterol (VENTOLIN HFA) 108 (90 Base) MCG/ACT inhaler Inhale into the lungs as needed for wheezing or shortness of breath.    aspirin EC 81 MG tablet Take 81 mg by mouth daily.   Calcium Carb-Cholecalciferol (CALCIUM 600/VITAMIN D3 PO) Take 1 tablet by mouth daily.   carvedilol (COREG) 6.25 MG tablet Take 6.25 mg by mouth 2 (two) times daily with a meal.    Cholecalciferol (VITAMIN D3) 50 MCG (2000 UT) TABS Take 2,000 Units by mouth every evening.   clopidogrel (PLAVIX) 75 MG tablet TAKE 1 TABLET BY MOUTH ONCE DAILY AT 6 AM   denosumab (PROLIA) 60 MG/ML SOSY injection Inject 60 mg into the skin once for 1 dose. Courier to rheum: [address], [place] [address]. Appt on 12/22/21   Fish Oil-Cholecalciferol (OMEGA-3 + VITAMIN D3 PO) Take 1 tablet by mouth daily at 12 noon.   fluticasone (FLONASE) 50 MCG/ACT nasal spray Place 1 spray into both nostrils daily as needed for rhinitis (runny nose/allergies.).   gabapentin (NEURONTIN) 300 MG capsule Take 300 mg by mouth 5 (five) times daily.    glucose blood test strip Accu-Chek Aviva Plus test strips  Use to check BS daily.   levothyroxine (SYNTHROID, LEVOTHROID) 125 MCG tablet Take 125 mcg by mouth daily before breakfast.   lisinopril (PRINIVIL,ZESTRIL) 10 MG tablet Take 10 mg by mouth daily.   metFORMIN (GLUCOPHAGE) 500 MG tablet Take 500 mg by mouth daily.   Multiple  Vitamins-Minerals (PRESERVISION AREDS 2 PO) Take 1 tablet by mouth in the morning and at bedtime.   rosuvastatin (CRESTOR) 40 MG tablet Take 1 tablet (40 mg total) by mouth daily.     Allergies:   Patient has no known allergies.   Social History   Socioeconomic History   Marital status: Widowed    Spouse name: Not on file   Number of children: Not on file   Years of education: Not on file   Highest education level: Not on file  Occupational History   Not on file  Tobacco Use   Smoking status: Former    Packs/day: 0.50    Years: 50.00    Total pack years: 25.00    Types: Cigarettes    Quit date: 03/30/2019    Years since quitting: 3.0    Passive exposure: Never   Smokeless tobacco: Never  Vaping Use  Vaping Use: Never used  Substance and Sexual Activity   Alcohol use: No   Drug use: No   Sexual activity: Not Currently  Other Topics Concern   Not on file  Social History Narrative   Not on file   Social Determinants of Health   Financial Resource Strain: Not on file  Food Insecurity: Not on file  Transportation Needs: Not on file  Physical Activity: Not on file  Stress: Not on file  Social Connections: Not on file     Family History: The patient's family history includes Aneurysm in her brother; Arthritis in her brother; Colon cancer in her sister; Diabetes in her brother and father; Diverticulitis in her son; Heart attack in her brother; Heart disease in her brother, father, and mother. ROS:   Please see the history of present illness.    All other systems reviewed and are negative.  EKGs/Labs/Other Studies Reviewed:    The following studies were reviewed today:   Recent Labs: 12/07/2021: TSH 1.40 01/06/2022: ALT 9; BUN 21; Creat 0.72; Potassium 4.9; Sodium 139  Recent Lipid Panel No results found for: "CHOL", "TRIG", "HDL", "CHOLHDL", "VLDL", "LDLCALC", "LDLDIRECT"  Physical Exam:    VS:  BP (!) 128/58 (BP Location: Left Arm, Patient Position: Sitting)    Pulse 76   Ht 5' 4.5" (1.638 m)   Wt 196 lb 12.8 oz (89.3 kg)   SpO2 97%   BMI 33.26 kg/m     Wt Readings from Last 3 Encounters:  04/26/22 196 lb 12.8 oz (89.3 kg)  12/07/21 198 lb 6.4 oz (90 kg)  11/26/21 198 lb 1.6 oz (89.9 kg)     GEN:  Well nourished, well developed in no acute distress HEENT: Normal NECK: No JVD; No carotid bruits LYMPHATICS: No lymphadenopathy CARDIAC: RRR, no murmurs, rubs, gallops RESPIRATORY:  Clear to auscultation without rales, wheezing or rhonchi  ABDOMEN: Soft, non-tender, non-distended MUSCULOSKELETAL:  No edema; No deformity  SKIN: Warm and dry NEUROLOGIC:  Alert and oriented x 3 PSYCHIATRIC:  Normal affect    Signed, Shirlee More, MD  04/26/2022 4:35 PM    [facility]

## 2022-04-26 ENCOUNTER — Ambulatory Visit: Payer: 59 | Attending: Cardiology | Admitting: Cardiology

## 2022-04-26 ENCOUNTER — Encounter: Payer: Self-pay | Admitting: Cardiology

## 2022-04-26 VITALS — BP 128/58 | HR 76 | Ht 64.5 in | Wt 196.8 lb

## 2022-04-26 DIAGNOSIS — I2510 Atherosclerotic heart disease of native coronary artery without angina pectoris: Secondary | ICD-10-CM | POA: Diagnosis not present

## 2022-04-26 DIAGNOSIS — E782 Mixed hyperlipidemia: Secondary | ICD-10-CM

## 2022-04-26 DIAGNOSIS — I10 Essential (primary) hypertension: Secondary | ICD-10-CM

## 2022-04-26 NOTE — Patient Instructions (Signed)
Medication Instructions:  Your physician recommends that you continue on your current medications as directed. Please refer to the Current Medication list given to you today.  *If you need a refill on your cardiac medications before your next appointment, please call your pharmacy*   Lab Work: None If you have labs (blood work) drawn today and your tests are completely normal, you will receive your results only by: MyChart Message (if you have MyChart) OR A paper copy in the mail If you have any lab test that is abnormal or we need to change your treatment, we will call you to review the results.   Testing/Procedures: None   Follow-Up: At [facility], you and your health needs are our priority.  As part of our continuing mission to provide you with exceptional heart care, we have created designated Provider Care Teams.  These Care Teams include your primary Cardiologist (physician) and Advanced Practice Providers (APPs -  Physician Assistants and Nurse Practitioners) who all work together to provide you with the care you need, when you need it.  We recommend signing up for the patient portal called "MyChart".  Sign up information is provided on this After Visit Summary.  MyChart is used to connect with patients for Virtual Visits (Telemedicine).  Patients are able to view lab/test results, encounter notes, upcoming appointments, etc.  Non-urgent messages can be sent to your provider as well.   To learn more about what you can do with MyChart, go to [email or web address].    Your next appointment:   1 year(s)  Provider:   Brian Munley, MD    Other Instructions None  

## 2022-05-27 ENCOUNTER — Other Ambulatory Visit (HOSPITAL_COMMUNITY): Payer: Self-pay

## 2022-05-31 ENCOUNTER — Other Ambulatory Visit: Payer: Self-pay

## 2022-05-31 ENCOUNTER — Other Ambulatory Visit (HOSPITAL_COMMUNITY): Payer: Self-pay

## 2022-06-02 ENCOUNTER — Telehealth: Payer: Self-pay

## 2022-06-02 NOTE — Telephone Encounter (Signed)
Patient requesting lab order and codes be sent to PCP.   Lab needed: CMP Code: M81.0 and Z79.899  Faxed via EPIC the above information.

## 2022-06-03 ENCOUNTER — Other Ambulatory Visit (HOSPITAL_COMMUNITY): Payer: Self-pay

## 2022-06-04 ENCOUNTER — Telehealth: Payer: Self-pay | Admitting: Internal Medicine

## 2022-06-04 ENCOUNTER — Other Ambulatory Visit: Payer: Self-pay

## 2022-06-04 ENCOUNTER — Other Ambulatory Visit (HOSPITAL_COMMUNITY): Payer: Self-pay

## 2022-06-04 ENCOUNTER — Telehealth: Payer: Self-pay | Admitting: Pharmacist

## 2022-06-04 DIAGNOSIS — M810 Age-related osteoporosis without current pathological fracture: Secondary | ICD-10-CM

## 2022-06-04 MED ORDER — DENOSUMAB 60 MG/ML ~~LOC~~ SOSY
60.0000 mg | PREFILLED_SYRINGE | Freq: Once | SUBCUTANEOUS | 0 refills | Status: DC
  Filled 2022-06-04: qty 1, 180d supply, fill #0

## 2022-06-04 NOTE — Telephone Encounter (Signed)
Patient due for Prolia on 06/20/2022. Per test claim, patient has no copay. Rx sent to [facility] to have med shipped to clinic.  Patient needs updated labs prior to proceeding. It appears that CMP order was faxed to PCP office today by Roselyn Reef, CMA  Knox Saliva, PharmD, MPH, BCPS, CPP Clinical Pharmacist (Rheumatology and Pulmonology)

## 2022-06-04 NOTE — Telephone Encounter (Signed)
Noted. Thnaks!  Knox Saliva, PharmD, MPH, BCPS, CPP Clinical Pharmacist (Rheumatology and Pulmonology)

## 2022-06-04 NOTE — Telephone Encounter (Signed)
Patient called the office stating she is scheduled for Prolia on Monday with Dr. Benjamine Mola. Patient would like to know if she can get her labs done the same day. Please advise.

## 2022-06-04 NOTE — Telephone Encounter (Addendum)
We do not have Prolia in clinic yet. She will either have to come back to receive Prolia or r/s appt with Dr. Benjamine Mola. Please ask pt what sh eprefers. She will need updated labwork prior to proceeding with Prolia as well  She is not technically due for Prolia until 06/20/2022   Knox Saliva, PharmD, MPH, BCPS, CPP Clinical Pharmacist (Rheumatology and Pulmonology)

## 2022-06-04 NOTE — Telephone Encounter (Signed)
Spoke with patient and she states that she lives quite a ways from the office and would prefer to have everything done at one visit. Patient states her PCP is willing to draw her labs. Patient asked for lab orders to be faxed to [facility]. Patient advised I would fax those first thing Monday for her. Patient rescheduled her appointment for 06/23/2022 at 11:20 am.

## 2022-06-07 ENCOUNTER — Other Ambulatory Visit: Payer: Self-pay | Admitting: *Deleted

## 2022-06-07 ENCOUNTER — Ambulatory Visit: Payer: Medicare Other | Admitting: Internal Medicine

## 2022-06-07 ENCOUNTER — Telehealth: Payer: Self-pay

## 2022-06-07 DIAGNOSIS — E559 Vitamin D deficiency, unspecified: Secondary | ICD-10-CM

## 2022-06-07 DIAGNOSIS — Z79899 Other long term (current) drug therapy: Secondary | ICD-10-CM

## 2022-06-07 DIAGNOSIS — M810 Age-related osteoporosis without current pathological fracture: Secondary | ICD-10-CM

## 2022-06-07 NOTE — Telephone Encounter (Signed)
Contacted patient and advised the lab orders were sent to her Primary Care Physician. Patient verbalized understanding.

## 2022-06-08 NOTE — Telephone Encounter (Signed)
Labs from 04/28/22 received from [facility] wnl Calcium 9.7 CBC wnl  Vitamin D was not drawn  Ok to proceed with Prolia on 06/23/2022  Knox Saliva, PharmD, MPH, BCPS, CPP Clinical Pharmacist (Rheumatology and Pulmonology)

## 2022-06-08 NOTE — Telephone Encounter (Signed)
Prolia received from [facility]. Placed in [place], PharmD, MPH, BCPS, CPP Clinical Pharmacist (Rheumatology and Pulmonology)

## 2022-06-09 LAB — COMPLETE METABOLIC PANEL WITH GFR
AG Ratio: 1.6 (calc) (ref 1.0–2.5)
ALT: 12 U/L (ref 6–29)
AST: 15 U/L (ref 10–35)
Albumin: 4.4 g/dL (ref 3.6–5.1)
Alkaline phosphatase (APISO): 47 U/L (ref 37–153)
BUN: 24 mg/dL (ref 7–25)
CO2: 22 mmol/L (ref 20–32)
Calcium: 9.5 mg/dL (ref 8.6–10.4)
Chloride: 107 mmol/L (ref 98–110)
Creat: 0.85 mg/dL (ref 0.60–1.00)
Globulin: 2.7 g/dL (calc) (ref 1.9–3.7)
Glucose, Bld: 121 mg/dL — ABNORMAL HIGH (ref 65–99)
Potassium: 4.4 mmol/L (ref 3.5–5.3)
Sodium: 142 mmol/L (ref 135–146)
Total Bilirubin: 0.4 mg/dL (ref 0.2–1.2)
Total Protein: 7.1 g/dL (ref 6.1–8.1)
eGFR: 73 mL/min/{1.73_m2} (ref >=60)

## 2022-06-09 LAB — VITAMIN D 25 HYDROXY (VIT D DEFICIENCY, FRACTURES): Vit D, 25-Hydroxy: 67 ng/mL (ref 30–100)

## 2022-06-09 NOTE — Progress Notes (Signed)
Vitamin D level and metabolic panel are normal. No problems for the prolia treatment.

## 2022-06-23 ENCOUNTER — Encounter: Payer: Self-pay | Admitting: Internal Medicine

## 2022-06-23 ENCOUNTER — Ambulatory Visit: Payer: 59 | Attending: Internal Medicine | Admitting: Internal Medicine

## 2022-06-23 VITALS — BP 156/86 | HR 72 | Resp 12 | Ht 64.5 in | Wt 196.4 lb

## 2022-06-23 DIAGNOSIS — M810 Age-related osteoporosis without current pathological fracture: Secondary | ICD-10-CM | POA: Diagnosis not present

## 2022-06-23 DIAGNOSIS — M159 Polyosteoarthritis, unspecified: Secondary | ICD-10-CM

## 2022-06-23 HISTORY — DX: Polyosteoarthritis, unspecified: M15.9

## 2022-06-23 MED ORDER — DENOSUMAB 60 MG/ML ~~LOC~~ SOSY
60.0000 mg | PREFILLED_SYRINGE | Freq: Once | SUBCUTANEOUS | Status: AC
  Administered 2022-06-23: 60 mg via SUBCUTANEOUS

## 2022-06-23 NOTE — Progress Notes (Signed)
Office Visit Note  Patient: Cathy Haney             Date of Birth: 1951/03/03           MRN: [id number]             PCP: Gala Lewandowsky, MD Referring: Gala Lewandowsky, MD Visit Date: 06/23/2022   Subjective:  Follow-up   History of Present Illness: Cathy SCHWARTZENBERGE is a 72 y.o. female here for follow up for osteoporosis now on Prolia treatment with dose scheduled today also to discuss some osteoarthritis symptoms.  Prolia treatment second dose today she did not have any complications with the first medication start.  Has not sustained any new falls over the past 6 months.  She is taking supplemental calcium and vitamin D as directed.  Recent labs were obtained including metabolic panel and vitamin D with calcium estimated GFR and vitamin D in goal range.  She is using a rolling walker when walking outside of the home most times.  Has some difficulty with transporting this and just uses the cart or buggy as assistance most times while shopping.  She is to continue to follow-up with orthopedic surgery as discussed the treatment options for advanced knee osteoarthritis including possible viscosupplementation injections due to limited benefit duration with the steroid shots but recommending against surgeries if possible.  She is here today with her sister.  Previous HPI 12/07/21 Cathy Haney is a 72 y.o. female here for osteoporosis currently treated with fosamax but with lack of improvement or possible progressive decline on repeat bone densitometry.  She is not exactly sure how long she is taking the Fosamax medication but has been for several years and reports good adherence to this.  She has no history of any bone fractures.  She has hypothyroidism stable on thyroid replacement medication and is a former cigarette smoker.  She denies any history of kidney stones.  She denies any long-term use of corticosteroids.  She has not fallen within the past 2 years.  She is taking a vitamin D3 supplement  daily appears to be 2000 units. Besides the osteoporosis she does also complain of some joint pain symptoms as well.  She has chronic low back pain that is aggravated with prolonged standing or walking and radiation of symptoms into the left leg.  This occurs within less than a few 100 feet.  Symptoms are alleviated by sitting or standing with a support for forward bending such as a counter walker or cart.  She denies any numbness or loss of sensation in her legs or feet. She does experience numbness and tingling symptoms in the left hand.  She had similar problems in her right hand with previous carpal tunnel release surgery to resolve this.  She has been performing some of the range of motion exercises for this.   Her daughter is present at our visit today.   DEXA reviewed DualFemur Total Left 06/11/21            -1.7      0.798 03/19/20          -1.3      0.844 02/27/2018        -1.7      0.793 02/25/16          -2.0      0.755 01/19/02          -1.6      0.818   DualFemur Mean 06/11/21            -  1.4      0.837 03/19/20          -1.1      0.868 02/27/18            -1.5      0.823 02/25/16          -1.9      0.764 01/19/02          -1.4      0.830   Left forearm Radius 06/11/21            -3.1      0.606 03/19/20          -2.9      0.626 02/27/18            -2.9      0.621   Review of Systems  Constitutional:  Positive for fatigue.  HENT:  Negative for mouth sores and mouth dryness.   Eyes:  Negative for dryness.  Respiratory:  Negative for shortness of breath.   Cardiovascular:  Negative for chest pain and palpitations.  Gastrointestinal:  Negative for blood in stool, constipation and diarrhea.  Endocrine: Positive for increased urination.  Genitourinary:  Negative for involuntary urination.  Musculoskeletal:  Positive for joint pain, joint pain, muscle weakness, morning stiffness and muscle tenderness. Negative for gait problem, joint swelling, myalgias and myalgias.  Skin:   Positive for hair loss. Negative for color change, rash and sensitivity to sunlight.  Allergic/Immunologic: Negative for susceptible to infections.  Neurological:  Negative for dizziness and headaches.  Hematological:  Negative for swollen glands.  Psychiatric/Behavioral:  Positive for sleep disturbance. Negative for depressed mood. The patient is not nervous/anxious.     PMFS History:  Patient Active Problem List   Diagnosis Date Noted   Generalized osteoarthritis of multiple sites 06/23/2022   Age-related osteoporosis without current pathological fracture 12/07/2021   Vitamin D deficiency 12/07/2021   Pre-diabetes    Pneumonia    Hypothyroidism    Dyspnea    Depression    Arthritis    Anxiety    Anemia    Allergy    Acquired hypothyroidism 07/06/2019   CAD (coronary artery disease) 06/27/2019   Coronary artery calcification seen on CT scan    Shortness of breath 06/19/2019   Mixed hyperlipidemia 06/19/2019   Prediabetes 06/19/2019   Obesity (BMI 30-39.9) 06/19/2019   AAA (abdominal aortic aneurysm) without rupture ([place]) 06/19/2019   Preoperative cardiovascular examination 06/01/2017   Diverticulitis 05/30/2017   Thyroid disease 05/30/2017   Benign hypertension 05/30/2017   Aortic aneurysm of unspecified site, without rupture ([place]) 05/30/2017   Benign essential hypertension 05/30/2017   Abdominal aortic aneurysm ([place]) 05/27/2017    Past Medical History:  Diagnosis Date   AAA (abdominal aortic aneurysm) without rupture ([place]) 06/19/2019   Abdominal aortic aneurysm ([place]) 05/27/2017   Acquired hypothyroidism 07/06/2019   Allergy    Anemia    during pregnancy   Anxiety    hx   Aortic aneurysm of unspecified site, without rupture ([place]) 05/30/2017   Arthritis    Benign essential hypertension 05/30/2017   Benign hypertension 05/30/2017   CAD (coronary artery disease) 06/27/2019   Coronary artery calcification seen on CT scan    Depression    hx has meds as needed   Diverticulitis     Dyspnea    Hypothyroidism    Aquired   Mixed hyperlipidemia    Obesity (BMI 30-39.9) 06/19/2019   Pneumonia    in the past  Pre-diabetes    diet controlled, doesnt eat sweets   Prediabetes    Preoperative cardiovascular examination 06/01/2017   Shortness of breath 06/19/2019   Thyroid disease     Family History  Problem Relation Age of Onset   Heart disease Mother    Heart disease Father    Diabetes Father    Colon cancer Sister    Arthritis Brother    Aneurysm Brother    Heart attack Brother    Diabetes Brother    Heart disease Brother    Diverticulitis Son    Past Surgical History:  Procedure Laterality Date   ABDOMINAL AORTA STENT  07/05/2019   ABDOMINAL AORTIC ENDOVASCULAR STENT GRAFT (N/A Groin)    ABDOMINAL AORTIC ENDOVASCULAR STENT GRAFT N/A 07/05/2019   Procedure: ABDOMINAL AORTIC ENDOVASCULAR STENT GRAFT;  Surgeon: Waynetta Sandy, MD;  Location: [facility];  Service: Vascular;  Laterality: N/A;   BACK SURGERY     khyphoplasty   CARPAL TUNNEL RELEASE     COLONOSCOPY     ENDARTERECTOMY FEMORAL  07/05/2019   Procedure: Right Endarterectomy Femoral;  Surgeon: Waynetta Sandy, MD;  Location: [facility];  Service: Vascular;;   EXCISION OF SKIN LESION     left arm   LEFT HEART CATH AND CORONARY ANGIOGRAPHY N/A 06/27/2019   Procedure: LEFT HEART CATH AND CORONARY ANGIOGRAPHY;  Surgeon: Belva Crome, MD;  Location: [facility];  Service: Cardiovascular;  Laterality: N/A;   TOE SURGERY     nails removed   TONSILLECTOMY     Social History   Social History Narrative   Not on file    There is no immunization history on file for this patient.   Objective: Vital Signs: BP (!) 156/86 (BP Location: Left Arm, Patient Position: Sitting, Cuff Size: Small)   Pulse 72   Resp 12   Ht 5' 4.5" (1.638 m)   Wt 196 lb 6.4 oz (89.1 kg)   BMI 33.19 kg/m    Physical Exam Constitutional:      Appearance: She is obese.  Cardiovascular:     Rate and Rhythm:  Normal rate and regular rhythm.  Pulmonary:     Effort: Pulmonary effort is normal.     Breath sounds: Normal breath sounds.  Musculoskeletal:     Right lower leg: No edema.     Left lower leg: No edema.  Skin:    General: Skin is warm and dry.     Findings: No rash.  Neurological:     Mental Status: She is alert.  Psychiatric:        Mood and Affect: Mood normal.      Musculoskeletal Exam:  Shoulders full ROM no tenderness or swelling Elbows full ROM no tenderness or swelling Wrists full ROM no tenderness or swelling Fingers full ROM no tenderness or swelling Heberden's nodes present on both hands worse distally No paraspinal tenderness to palpation over upper and lower back Hip normal internal and external rotation without pain, no tenderness to lateral hip palpation Knees full ROM minimal tenderness to pressure and no palpable effusions, prominent crepitus bilaterally Ankles full ROM no tenderness or swelling   Investigation: No additional findings.  Imaging: No results found.  Recent Labs: Lab Results  Component Value Date   WBC 14.0 (H) 07/06/2019   HGB 9.1 (L) 07/06/2019   PLT 208 07/06/2019   NA 142 06/07/2022   K 4.4 06/07/2022   CL 107 06/07/2022   CO2 22 06/07/2022  GLUCOSE 121 (H) 06/07/2022   BUN 24 06/07/2022   CREATININE 0.85 06/07/2022   BILITOT 0.4 06/07/2022   ALKPHOS 51 07/03/2019   AST 15 06/07/2022   ALT 12 06/07/2022   PROT 7.1 06/07/2022   ALBUMIN 4.0 07/03/2019   CALCIUM 9.5 06/07/2022   GFRAA >60 07/06/2019    Speciality Comments: No specialty comments available.  Procedures:  No procedures performed Allergies: Patient has no known allergies.   Assessment / Plan:     Visit Diagnoses: Age-related osteoporosis without current pathological fracture - Plan: denosumab (PROLIA) injection 60 mg  Plan to continue with the Prolia injections started since September 2023.  Recommend repeat bone density testing in 2025 for assessing  response to therapy.  Continuing vitamin D and calcium supplementation.  Encouraged her to continue weightbearing activities as tolerable even if needing to take breaks every 10 to 15 minutes due to having some back or knee pain increase.  Generalized osteoarthritis of multiple sites  Discussed osteoarthritis management for several minutes including discussion of weightbearing but low impact exercises or some light resistance exercises to try and maximize muscle conditioning and joint stability.  This would also have the benefit for hopefully limiting fall risk.  Also discussed some supplement treatment options for arthritis including turmeric, ginger, omega fatty acid, glucosamine, or topical therapies provided printed handouts today.  Orders: No orders of the defined types were placed in this encounter.  Meds ordered this encounter  Medications   denosumab (PROLIA) injection 60 mg    Order Specific Question:   Patient is enrolled in [facility] program for this medication and I have provided a copy of the Prolia Medication Guide and Patient Brochure.    Answer:   Yes    Order Specific Question:   I have reviewed with the patient the information in the Prolia Medication Guide and Patient Counseling Chart including the serious risks of Prolia and symptoms of each risk.    Answer:   Yes    Order Specific Question:   I have advised the patient to seek medical attention if they have signs or symptoms of any of the serious risks.    Answer:   Yes     Follow-Up Instructions: Return in about 6 months (around 12/24/2022) for OP/OA prolia f/u 41mos.   30 minutes were spent in encounter today including review of recent laboratory testing, discussing history and physical exam, lengthy counseling on exercises and gait training work to do at home including several examples discussion of supplements and questions about specific gym equipment, counseling and documentation.  Collier Salina, MD  Note - This  record has been created using [employer].  Chart creation errors have been sought, but may not always  have been located. Such creation errors do not reflect on  the standard of medical care.

## 2022-06-23 NOTE — Progress Notes (Signed)
Pharmacy Note  Subjective:   Patient presents to clinic today to receive bi-annual dose of Prolia. Patient's last dose of Prolia was on 12/22/2021  Patient running a fever or have signs/symptoms of infection? No  Patient currently on antibiotics for the treatment of infection? No  Patient had fall in the last 6 months?  No  If yes, did it require medical attention? No   Patient taking calcium 1200 mg daily through diet or supplement and at least 800 units vitamin D? Yes  Objective: CMP     Component Value Date/Time   NA 142 06/07/2022 0957   NA 137 06/22/2019 0942   K 4.4 06/07/2022 0957   CL 107 06/07/2022 0957   CO2 22 06/07/2022 0957   GLUCOSE 121 (H) 06/07/2022 0957   BUN 24 06/07/2022 0957   BUN 13 06/22/2019 0942   CREATININE 0.85 06/07/2022 0957   CALCIUM 9.5 06/07/2022 0957   PROT 7.1 06/07/2022 0957   ALBUMIN 4.0 07/03/2019 1135   AST 15 06/07/2022 0957   ALT 12 06/07/2022 0957   ALKPHOS 51 07/03/2019 1135   BILITOT 0.4 06/07/2022 0957   GFRNONAA >60 07/06/2019 0434   GFRAA >60 07/06/2019 0434    CBC    Component Value Date/Time   WBC 14.0 (H) 07/06/2019 0434   RBC 2.96 (L) 07/06/2019 0434   HGB 9.1 (L) 07/06/2019 0434   HGB 12.1 06/22/2019 0942   HCT 27.5 (L) 07/06/2019 0434   HCT 36.5 06/22/2019 0942   PLT 208 07/06/2019 0434   PLT 227 06/22/2019 0942   MCV 92.9 07/06/2019 0434   MCV 94 06/22/2019 0942   MCH 30.7 07/06/2019 0434   MCHC 33.1 07/06/2019 0434   RDW 13.3 07/06/2019 0434   RDW 13.2 06/22/2019 0942    Lab Results  Component Value Date   VD25OH 67 06/07/2022    T-score (06/11/2021): BMD measured at forearm radius 33% is 0.621 g/cm2 with a T-score of -2.9. This patient is considered osteoporotic according to [facility] criteria.   Assessment/Plan:   Administrations This Visit     denosumab (PROLIA) injection 60 mg     Admin Date 06/23/2022 Action Given Dose 60 mg Route Subcutaneous Administered By Maryan Puls, [facility]             Reviewed importance of adequate dietary intake of calcium in addition to supplementation due to risk of hypocalcemia with Prolia.   Patient tolerated injection without issue.   Patient's next Prolia dose is due on 12/20/2022.  Patient is due for updated DEXA in 05/2023.   All questions encouraged and answered.  Instructed patient to call with any further questions or concerns.  Maryan Puls, PharmD PGY-1 [facility] Resident

## 2022-11-16 ENCOUNTER — Other Ambulatory Visit (HOSPITAL_COMMUNITY): Payer: Self-pay

## 2022-11-18 ENCOUNTER — Other Ambulatory Visit: Payer: Self-pay

## 2022-11-18 DIAGNOSIS — I739 Peripheral vascular disease, unspecified: Secondary | ICD-10-CM

## 2022-11-18 DIAGNOSIS — I714 Abdominal aortic aneurysm, without rupture, unspecified: Secondary | ICD-10-CM

## 2022-11-26 HISTORY — PX: CARPAL TUNNEL RELEASE: SHX101

## 2022-12-01 ENCOUNTER — Ambulatory Visit: Payer: 59 | Admitting: Physician Assistant

## 2022-12-01 ENCOUNTER — Ambulatory Visit (HOSPITAL_COMMUNITY)
Admission: RE | Admit: 2022-12-01 | Discharge: 2022-12-01 | Disposition: A | Discharge status: Home / Self Care | Payer: 59 | Source: Ambulatory Visit | Attending: Vascular Surgery | Admitting: Vascular Surgery

## 2022-12-01 ENCOUNTER — Ambulatory Visit (INDEPENDENT_AMBULATORY_CARE_PROVIDER_SITE_OTHER)
Admission: RE | Admit: 2022-12-01 | Discharge: 2022-12-01 | Disposition: A | Discharge status: Home / Self Care | Payer: 59 | Source: Ambulatory Visit | Attending: Vascular Surgery | Admitting: Vascular Surgery

## 2022-12-01 VITALS — BP 178/82 | HR 81 | Temp 97.6°F | Resp 18 | Ht 64.5 in | Wt 200.0 lb

## 2022-12-01 DIAGNOSIS — I714 Abdominal aortic aneurysm, without rupture, unspecified: Secondary | ICD-10-CM | POA: Diagnosis present

## 2022-12-01 DIAGNOSIS — I70212 Atherosclerosis of native arteries of extremities with intermittent claudication, left leg: Secondary | ICD-10-CM

## 2022-12-01 DIAGNOSIS — I739 Peripheral vascular disease, unspecified: Secondary | ICD-10-CM | POA: Insufficient documentation

## 2022-12-01 LAB — VAS US ABI WITH/WO TBI
Left ABI: 0.52
Right ABI: 0.87

## 2022-12-01 NOTE — Progress Notes (Signed)
[facility] [place] HISTORY AND PHYSICAL   History of Present Illness:  Patient is a 72 y.o. year old female who presents for evaluation of abdominal aortic aneurysm.  She is s/p  EVAR on 07/05/2019 by Dr. Randie Heinz.   She denies abdominal pain or lumbar pain.  She does have claudication symptoms on the left LE with known left SFA occlusion.  She states she has not had a change in her claudication distance which is 2-3 blocks.  She tries to push through the pain.  She denies rest pain and non healing wounds.   She states she can continue with short distance walking and try to increase it over time.  She is medically managed on ASA and daily Statin.      Past Medical History:  Diagnosis Date   AAA (abdominal aortic aneurysm) without rupture (HCC) 06/19/2019   Abdominal aortic aneurysm (HCC) 05/27/2017   Acquired hypothyroidism 07/06/2019   Allergy    Anemia    during pregnancy   Anxiety    hx   Aortic aneurysm of unspecified site, without rupture (HCC) 05/30/2017   Arthritis    Benign essential hypertension 05/30/2017   Benign hypertension 05/30/2017   CAD (coronary artery disease) 06/27/2019   Coronary artery calcification seen on CT scan    Depression    hx has meds as needed   Diverticulitis    Dyspnea    Hypothyroidism    Aquired   Mixed hyperlipidemia    Obesity (BMI 30-39.9) 06/19/2019   Pneumonia    in the past   Pre-diabetes    diet controlled, doesnt eat sweets   Prediabetes    Preoperative cardiovascular examination 06/01/2017   Shortness of breath 06/19/2019   Thyroid disease     Past Surgical History:  Procedure Laterality Date   ABDOMINAL AORTA STENT  07/05/2019   ABDOMINAL AORTIC ENDOVASCULAR STENT GRAFT (N/A Groin)    ABDOMINAL AORTIC ENDOVASCULAR STENT GRAFT N/A 07/05/2019   Procedure: ABDOMINAL AORTIC ENDOVASCULAR STENT GRAFT;  Surgeon: [name], MD;  Location: [facility] OR;  Service: Vascular;  Laterality: N/A;   BACK SURGERY     khyphoplasty    CARPAL TUNNEL RELEASE     COLONOSCOPY     ENDARTERECTOMY FEMORAL  07/05/2019   Procedure: Right Endarterectomy Femoral;  Surgeon: [name], MD;  Location: [facility] OR;  Service: Vascular;;   EXCISION OF SKIN LESION     left arm   LEFT HEART CATH AND CORONARY ANGIOGRAPHY N/A 06/27/2019   Procedure: LEFT HEART CATH AND CORONARY ANGIOGRAPHY;  Surgeon: Lyn Records, MD;  Location: [facility] IN[facility];  Service: Cardiovascular;  Laterality: N/A;   TOE SURGERY     nails removed   TONSILLECTOMY       Social History Social History   Tobacco Use   Smoking status: Former    Current packs/day: 0.00    Average packs/day: 0.5 packs/day for 50.0 years (25.0 ttl pk-yrs)    Types: Cigarettes    Start date: 03/29/1969    Quit date: 03/30/2019    Years since quitting: 3.6    Passive exposure: Never   Smokeless tobacco: Never  Vaping Use   Vaping status: Never Used  Substance Use Topics   Alcohol use: No   Drug use: No    Family History Family History  Problem Relation Age of Onset   Heart disease Mother    Heart disease Father    Diabetes Father    Colon  cancer Sister    Arthritis Brother    Aneurysm Brother    Heart attack Brother    Diabetes Brother    Heart disease Brother    Diverticulitis Son     Allergies  No Known Allergies   Current Outpatient Medications  Medication Sig Dispense Refill   albuterol (VENTOLIN HFA) 108 (90 Base) MCG/ACT inhaler Inhale into the lungs as needed for wheezing or shortness of breath.      aspirin EC 81 MG tablet Take 81 mg by mouth daily.     Azelastine HCl 137 MCG/SPRAY SOLN INHALE 2 SPRAY(S) IN EACH NOSTRIL TWICE DAILY AS NEEDED     Calcium Carb-Cholecalciferol (CALCIUM 600/VITAMIN D3 PO) Take 1 tablet by mouth daily.     carvedilol (COREG) 6.25 MG tablet Take 6.25 mg by mouth 2 (two) times daily with a meal.      carvedilol (COREG) 6.25 MG tablet Take by mouth.     Cholecalciferol (VITAMIN D3) 50 MCG (2000 UT) TABS Take 2,000  Units by mouth every evening.     clopidogrel (PLAVIX) 75 MG tablet TAKE 1 TABLET BY MOUTH ONCE DAILY AT 6 AM 30 tablet 6   FARXIGA 10 MG TABS tablet Take 1 tablet every day by oral route with meals.     Fish Oil-Cholecalciferol (OMEGA-3 + VITAMIN D3 PO) Take 1 tablet by mouth daily at 12 noon.     fluticasone (FLONASE) 50 MCG/ACT nasal spray Place 1 spray into both nostrils daily as needed for rhinitis (runny nose/allergies.).     gabapentin (NEURONTIN) 300 MG capsule Take 300 mg by mouth 5 (five) times daily.      glucose blood test strip Accu-Chek Aviva Plus test strips  Use to check BS daily.     levothyroxine (SYNTHROID, LEVOTHROID) 125 MCG tablet Take 125 mcg by mouth daily before breakfast.     lisinopril (PRINIVIL,ZESTRIL) 10 MG tablet Take 10 mg by mouth daily.     metFORMIN (GLUCOPHAGE) 500 MG tablet Take 500 mg by mouth daily.     Multiple Vitamins-Minerals (PRESERVISION AREDS 2 PO) Take 1 tablet by mouth in the morning and at bedtime.     rosuvastatin (CRESTOR) 40 MG tablet Take 1 tablet (40 mg total) by mouth daily. 90 tablet 3   denosumab (PROLIA) 60 MG/ML SOSY injection Inject 60 mg into the skin once for 1 dose. Courier to rheum: [address], [place] [address]. Appt on 06/10/22 1 mL 0   No current facility-administered medications for this visit.    ROS:   General:  No weight loss, Fever, chills  HEENT: No recent headaches, no nasal bleeding, no visual changes, no sore throat  Neurologic: No dizziness, blackouts, seizures. No recent symptoms of stroke or mini- stroke. No recent episodes of slurred speech, or temporary blindness.  Cardiac: No recent episodes of chest pain/pressure, no shortness of breath at rest.  No shortness of breath with exertion.  Denies history of atrial fibrillation or irregular heartbeat  Vascular: No history of rest pain in feet.  No history of claudication.  No history of non-healing ulcer, No history of DVT   Pulmonary: No home  oxygen, no productive cough, no hemoptysis,  No asthma or wheezing  Musculoskeletal:  [ ]  Arthritis, [ ]  Low back pain,  [ ]  Joint pain  Hematologic:No history of hypercoagulable state.  No history of easy bleeding.  No history of anemia  Gastrointestinal: No hematochezia or melena,  No gastroesophageal reflux, no trouble  swallowing  Urinary: [ ]  chronic Kidney disease, [ ]  on HD - [ ]  MWF or [ ]  TTHS, [ ]  Burning with urination, [ ]  Frequent urination, [ ]  Difficulty urinating;   Skin: No rashes  Psychological: No history of anxiety,  No history of depression   Physical Examination  Vitals:   12/01/22 1032  BP: (!) 178/82  Pulse: 81  Resp: 18  Temp: 97.6 F (36.4 C)  TempSrc: Temporal  SpO2: 97%  Weight: 200 lb (90.7 kg)  Height: 5' 4.5" (1.638 m)    Body mass index is 33.8 kg/m.  General:  Alert and oriented, no acute distress HEENT: Normal Neck: No bruit or JVD Pulmonary: Clear to auscultation bilaterally Cardiac: Regular Rate and Rhythm without murmur Gastrointestinal: Soft, non-tender, non-distended, no mass, no scars Skin: No rash Extremity Pulses:  non palpable pedal pulses B, skin warm with intact motor.  No ischemic changes Musculoskeletal: No deformity or edema  Neurologic: Upper and lower extremity motor 5/5 and symmetric  DATA:  Endovascular Aortic Repair (EVAR):  +----------+----------------+-------------------+-------------------+           Diameter AP (cm)Diameter Trans (cm)Velocities (cm/sec)  +----------+----------------+-------------------+-------------------+  Aorta    3.34            3.80               36                   +----------+----------------+-------------------+-------------------+  Right Limb1.34                               66                   +----------+----------------+-------------------+-------------------+  Left Limb 1.06                               47                    +----------+----------------+-------------------+-------------------+   Summary:  Abdominal Aorta: Patent endovascular aneurysm repair with no evidence of  endoleak.     ABI Findings:  +---------+------------------+-----+-----------+--------+  Right   Rt Pressure (mmHg)IndexWaveform   Comment   +---------+------------------+-----+-----------+--------+  Brachial 165                                         +---------+------------------+-----+-----------+--------+  PTA     143               0.87 multiphasic          +---------+------------------+-----+-----------+--------+  DP      129               0.78 multiphasic          +---------+------------------+-----+-----------+--------+  Great Toe102               0.62 Abnormal             +---------+------------------+-----+-----------+--------+   +---------+------------------+-----+----------+-------+  Left    Lt Pressure (mmHg)IndexWaveform  Comment  +---------+------------------+-----+----------+-------+  Brachial 160                                       +---------+------------------+-----+----------+-------+  PTA  86                0.52 monophasic         +---------+------------------+-----+----------+-------+  DP      82                0.50 monophasic         +---------+------------------+-----+----------+-------+  Great Toe38                0.23 Abnormal           +---------+------------------+-----+----------+-------+   +-------+-----------+-----------+------------+------------+  ABI/TBIToday's ABIToday's TBIPrevious ABIPrevious TBI  +-------+-----------+-----------+------------+------------+  Right 0.87       0.62       0.92        0.40          +-------+-----------+-----------+------------+------------+  Left  0.52       0.23       0.57        0.26          +-------+-----------+-----------+------------+------------+        Arterial wall  calcification precludes accurate ankle pressures and ABIs.  PPG tracings display appropriate pulsatility.  Bilateral ABIs appear essentially unchanged. Right TBIs appear increased.  Left TBI appears essentially unchanged.    Summary:  Right: Resting right ankle-brachial index indicates mild right lower  extremity arterial disease. The right toe-brachial index is abnormal.   Left: Resting left ankle-brachial index indicates moderate left lower  extremity arterial disease. The left toe-brachial index is abnormal.   ASSESSMENT/PLAN: 72 y.o. female here with hx of EVAR on 07/05/2019 by Dr. Randie Heinz.  She has PAD in the left LE with tolerable claudication symptoms that are unchanged.  Her ABI's are unchanged monophasic wave forms on the left 0.52 and TBI of 0.23. The EVAR is patent without evidence of endoleak.   We discussed symptoms of ischemia and is she develops a non healing wounds or rest pain she will call our office.  I encouraged her to walk for exercise.  She will f/u for repeat studies in 3 month.     Mosetta Pigeon PA-C [facility] [place] Office: [phone]  MD in clinic [place]

## 2022-12-09 ENCOUNTER — Other Ambulatory Visit (HOSPITAL_COMMUNITY): Payer: Self-pay

## 2022-12-14 ENCOUNTER — Other Ambulatory Visit: Payer: Self-pay

## 2022-12-14 DIAGNOSIS — I739 Peripheral vascular disease, unspecified: Secondary | ICD-10-CM

## 2022-12-14 DIAGNOSIS — I714 Abdominal aortic aneurysm, without rupture, unspecified: Secondary | ICD-10-CM

## 2022-12-21 ENCOUNTER — Other Ambulatory Visit: Payer: Self-pay

## 2022-12-24 ENCOUNTER — Other Ambulatory Visit: Payer: Self-pay

## 2022-12-24 ENCOUNTER — Ambulatory Visit: Payer: 59 | Admitting: Internal Medicine

## 2022-12-30 ENCOUNTER — Other Ambulatory Visit: Payer: Self-pay

## 2022-12-30 ENCOUNTER — Telehealth: Payer: Self-pay | Admitting: Pharmacist

## 2022-12-30 ENCOUNTER — Other Ambulatory Visit (HOSPITAL_COMMUNITY): Payer: Self-pay

## 2022-12-30 DIAGNOSIS — M810 Age-related osteoporosis without current pathological fracture: Secondary | ICD-10-CM

## 2022-12-30 DIAGNOSIS — Z79899 Other long term (current) drug therapy: Secondary | ICD-10-CM

## 2022-12-30 MED ORDER — DENOSUMAB 60 MG/ML ~~LOC~~ SOSY
60.0000 mg | PREFILLED_SYRINGE | Freq: Once | SUBCUTANEOUS | 0 refills | Status: DC
  Filled 2022-12-30: qty 1, 180d supply, fill #0

## 2022-12-30 NOTE — Progress Notes (Signed)
Specialty Pharmacy Refill Coordination Note  Cathy Haney is a 72 y.o. female assessed today regarding refills of clinic administered specialty medication(s) Denosumab   Clinic requested Courier to Provider Office   Delivery date: 01/12/23   Verified address: [address], [place], [address]   Medication will be filled on 01/11/23.

## 2022-12-30 NOTE — Telephone Encounter (Signed)
Patient due for Prolia on 01/18/2023 (appt that she has with her sister). Per PCP,Dr. Laymond Purser' office, labs can be faxed with account #. Appt is not needed. Spoke with sister.  Labs faxed to PCP   Fax: [phone]  Rx for Prolia sent to [facility] to be couriered to clinic by 01/18/23  Chesley Mires, PharmD, MPH, BCPS, CPP Clinical Pharmacist (Rheumatology and Pulmonology)

## 2023-01-04 NOTE — Progress Notes (Signed)
Office Visit Note  Patient: Cathy Haney             Date of Birth: 16-Jan-1951           MRN: [id number]             PCP: Maris Berger, MD Referring: Maris Berger, MD Visit Date: 01/18/2023   Subjective:  No chief complaint on file.   History of Present Illness: Cathy Haney is a 72 y.o. female here for follow up for osteoporosis on prolia and with osteoarthritis of multiple joints.  Overall is feeling well has not suffered any new falls or injury.  She is not able to do a lot of weightbearing exercise due to significant knee arthritis treated with viscosupplementation injections she usually relies on a walker or leaning on a cart while away from home.  Is on gabapentin 300 mg 5 times daily not requiring other prescription medicines for arthritis.  She had recent blood count metabolic panel labs drawn which were fine.  Previous HPI 06/23/2022 Cathy Haney is a 72 y.o. female here for follow up for osteoporosis now on Prolia treatment with dose scheduled today also to discuss some osteoarthritis symptoms.  Prolia treatment second dose today she did not have any complications with the first medication start.  Has not sustained any new falls over the past 6 months.  She is taking supplemental calcium and vitamin D as directed.  Recent labs were obtained including metabolic panel and vitamin D with calcium estimated GFR and vitamin D in goal range.  She is using a rolling walker when walking outside of the home most times.  Has some difficulty with transporting this and just uses the cart or buggy as assistance most times while shopping.  She is to continue to follow-up with orthopedic surgery as discussed the treatment options for advanced knee osteoarthritis including possible viscosupplementation injections due to limited benefit duration with the steroid shots but recommending against surgeries if possible.   She is here today with her sister.   Previous HPI 12/07/21 Cathy Haney is  a 72 y.o. female here for osteoporosis currently treated with fosamax but with lack of improvement or possible progressive decline on repeat bone densitometry.  She is not exactly sure how long she is taking the Fosamax medication but has been for several years and reports good adherence to this.  She has no history of any bone fractures.  She has hypothyroidism stable on thyroid replacement medication and is a former cigarette smoker.  She denies any history of kidney stones.  She denies any long-term use of corticosteroids.  She has not fallen within the past 2 years.  She is taking a vitamin D3 supplement daily appears to be 2000 units. Besides the osteoporosis she does also complain of some joint pain symptoms as well.  She has chronic low back pain that is aggravated with prolonged standing or walking and radiation of symptoms into the left leg.  This occurs within less than a few 100 feet.  Symptoms are alleviated by sitting or standing with a support for forward bending such as a counter walker or cart.  She denies any numbness or loss of sensation in her legs or feet. She does experience numbness and tingling symptoms in the left hand.  She had similar problems in her right hand with previous carpal tunnel release surgery to resolve this.  She has been performing some of the range of motion exercises for this.  Her daughter is present at our visit today.   DEXA reviewed DualFemur Total Left 06/11/21            -1.7      0.798 03/19/20          -1.3      0.844 02/27/2018        -1.7      0.793 02/25/16          -2.0      0.755 01/19/02          -1.6      0.818   DualFemur Mean 06/11/21            -1.4      0.837 03/19/20          -1.1      0.868 02/27/18            -1.5      0.823 02/25/16          -1.9      0.764 01/19/02          -1.4      0.830   Left forearm Radius 06/11/21            -3.1      0.606 03/19/20          -2.9      0.626 02/27/18            -2.9      0.621   Review of  Systems  Constitutional:  Positive for fatigue.  HENT:  Negative for mouth sores and mouth dryness.   Eyes:  Negative for dryness.  Respiratory:  Negative for shortness of breath.   Cardiovascular:  Negative for chest pain and palpitations.  Gastrointestinal:  Negative for blood in stool, constipation and diarrhea.  Endocrine: Positive for increased urination.  Genitourinary:  Negative for involuntary urination.  Musculoskeletal:  Positive for myalgias and myalgias. Negative for joint pain, gait problem, joint pain, joint swelling, muscle weakness, morning stiffness and muscle tenderness.  Skin:  Negative for color change, rash, hair loss and sensitivity to sunlight.  Allergic/Immunologic: Negative for susceptible to infections.  Neurological:  Positive for dizziness. Negative for headaches.  Hematological:  Negative for swollen glands.  Psychiatric/Behavioral:  Positive for sleep disturbance. Negative for depressed mood. The patient is not nervous/anxious.     PMFS History:  Patient Active Problem List   Diagnosis Date Noted   Generalized osteoarthritis of multiple sites 06/23/2022   Age-related osteoporosis without current pathological fracture 12/07/2021   Vitamin D deficiency 12/07/2021   Pre-diabetes    Pneumonia    Hypothyroidism    Dyspnea    Depression    Arthritis    Anxiety    Anemia    Allergy    Acquired hypothyroidism 07/06/2019   CAD (coronary artery disease) 06/27/2019   Coronary artery calcification seen on CT scan    Shortness of breath 06/19/2019   Mixed hyperlipidemia 06/19/2019   Prediabetes 06/19/2019   Obesity (BMI 30-39.9) 06/19/2019   AAA (abdominal aortic aneurysm) without rupture (HCC) 06/19/2019   Preoperative cardiovascular examination 06/01/2017   Diverticulitis 05/30/2017   Thyroid disease 05/30/2017   Benign hypertension 05/30/2017   Aortic aneurysm of unspecified site, without rupture (HCC) 05/30/2017   Benign essential hypertension  05/30/2017   Abdominal aortic aneurysm (HCC) 05/27/2017    Past Medical History:  Diagnosis Date   AAA (abdominal aortic aneurysm) without rupture (HCC) 06/19/2019   Abdominal aortic aneurysm (HCC)  05/27/2017   Acquired hypothyroidism 07/06/2019   Allergy    Anemia    during pregnancy   Anxiety    hx   Aortic aneurysm of unspecified site, without rupture (HCC) 05/30/2017   Arthritis    Benign essential hypertension 05/30/2017   Benign hypertension 05/30/2017   CAD (coronary artery disease) 06/27/2019   Coronary artery calcification seen on CT scan    Depression    hx has meds as needed   Diverticulitis    Dyspnea    Hypothyroidism    Aquired   Mixed hyperlipidemia    Obesity (BMI 30-39.9) 06/19/2019   Pneumonia    in the past   Pre-diabetes    diet controlled, doesnt eat sweets   Prediabetes    Preoperative cardiovascular examination 06/01/2017   Shortness of breath 06/19/2019   Thyroid disease     Family History  Problem Relation Age of Onset   Heart disease Mother    Heart disease Father    Diabetes Father    Colon cancer Sister    Arthritis Brother    Aneurysm Brother    Heart attack Brother    Diabetes Brother    Heart disease Brother    Diverticulitis Son    Past Surgical History:  Procedure Laterality Date   ABDOMINAL AORTA STENT  07/05/2019   ABDOMINAL AORTIC ENDOVASCULAR STENT GRAFT (N/A Groin)    ABDOMINAL AORTIC ENDOVASCULAR STENT GRAFT N/A 07/05/2019   Procedure: ABDOMINAL AORTIC ENDOVASCULAR STENT GRAFT;  Surgeon: [name], MD;  Location[facility] OR;  Service: Vascular;  Laterality: N/A;   BACK SURGERY     khyphoplasty   CARPAL TUNNEL RELEASE     CARPAL TUNNEL RELEASE Left 11/26/2022   COLONOSCOPY     ENDARTERECTOMY FEMORAL  07/05/2019   Procedure: Right Endarterectomy Femoral;  Surgeon: [name], MD;  Location: [facility] OR;  Service: Vascular;;   EXCISION OF SKIN LESION     left arm   LEFT HEART CATH AND CORONARY ANGIOGRAPHY N/A  06/27/2019   Procedure: LEFT HEART CATH AND CORONARY ANGIOGRAPHY;  Surgeon: Lyn Records, MD;  Location: [facility] INVASIVE CV LAB;  Service: Cardiovascular;  Laterality: N/A;   TOE SURGERY     nails removed   TONSILLECTOMY     Social History   Social History Narrative   Not on file    There is no immunization history on file for this patient.   Objective: Vital Signs: BP 126/74 (BP Location: Left Arm, Patient Position: Sitting, Cuff Size: Normal)   Pulse 70   Resp 14   Ht 5\' 5"  (1.651 m)   Wt 199 lb (90.3 kg)   BMI 33.12 kg/m    Physical Exam Cardiovascular:     Rate and Rhythm: Normal rate and regular rhythm.  Pulmonary:     Effort: Pulmonary effort is normal.     Breath sounds: Normal breath sounds.  Skin:    General: Skin is warm and dry.  Neurological:     Mental Status: She is alert.  Psychiatric:        Mood and Affect: Mood normal.      Musculoskeletal Exam:  Shoulders full ROM no tenderness or swelling Elbows full ROM no tenderness or swelling Wrists full ROM no tenderness or swelling Fingers full ROM no tenderness or swelling Heberden's nodes present on both hands worse distally No paraspinal tenderness to palpation over upper and lower back Hip normal internal and external rotation without pain, no tenderness to lateral  hip palpation Knees full ROM minimal tenderness to pressure and no palpable effusions, prominent crepitus bilaterally Ankles full ROM no tenderness or swelling  Investigation: No additional findings.  Imaging: No results found.  Recent Labs: Lab Results  Component Value Date   WBC 6.6 01/05/2023   HGB 13.0 01/05/2023   PLT 235 01/05/2023   NA 140 01/05/2023   K 3.9 01/05/2023   CL 104 01/05/2023   CO2 27 01/05/2023   GLUCOSE 158 (H) 01/05/2023   BUN 23 01/05/2023   CREATININE 0.82 01/05/2023   BILITOT 0.4 01/05/2023   ALKPHOS 51 07/03/2019   AST 13 01/05/2023   ALT 9 01/05/2023   PROT 7.3 01/05/2023   ALBUMIN 4.0 07/03/2019    CALCIUM 10.0 01/05/2023   GFRAA >60 07/06/2019    Speciality Comments: No specialty comments available.  Procedures:  No procedures performed Allergies: Patient has no known allergies.   Assessment / Plan:     Visit Diagnoses: Age-related osteoporosis without current pathological fracture - Plan to continue with the Prolia injections. Continuing vitamin D and calcium supplementation. - Plan: DG BONE DENSITY (DXA), denosumab (PROLIA) injection 60 mg  Will plan to recheck bone density test and follow-up next year for about 2 months on Prolia treatment to reassess response.  Continuing on daily vitamin D supplementation.  Unfortunate not able to tolerate a lot of weightbearing exercise.  Generalized osteoarthritis of multiple sites  Continue conservative treatment and follow-up with her orthopedist for injection treatments and symptomatic management.  Also discussed supplemental treatments including turmeric, ginger, omega fatty acid, or glucosamine supplements.  Orders: Orders Placed This Encounter  Procedures   DG BONE DENSITY (DXA)   Meds ordered this encounter  Medications   denosumab (PROLIA) injection 60 mg    Order Specific Question:   Patient is enrolled in [facility] program for this medication and I have provided a copy of the Prolia Medication Guide and Patient Brochure.    Answer:   Yes    Order Specific Question:   I have reviewed with the patient the information in the Prolia Medication Guide and Patient Counseling Chart including the serious risks of Prolia and symptoms of each risk.    Answer:   Yes    Order Specific Question:   I have advised the patient to seek medical attention if they have signs or symptoms of any of the serious risks.    Answer:   Yes     Follow-Up Instructions: Return in about 6 months (around 07/19/2023) for OP on Prolia f/u 6mos.   Fuller Plan, MD  Note - This record has been created using [employer].  Chart creation errors have  been sought, but may not always  have been located. Such creation errors do not reflect on  the standard of medical care.

## 2023-01-06 LAB — COMPLETE METABOLIC PANEL WITH GFR
AG Ratio: 1.6 (calc) (ref 1.0–2.5)
ALT: 9 U/L (ref 6–29)
AST: 13 U/L (ref 10–35)
Albumin: 4.5 g/dL (ref 3.6–5.1)
Alkaline phosphatase (APISO): 49 U/L (ref 37–153)
BUN: 23 mg/dL (ref 7–25)
CO2: 27 mmol/L (ref 20–32)
Calcium: 10 mg/dL (ref 8.6–10.4)
Chloride: 104 mmol/L (ref 98–110)
Creat: 0.82 mg/dL (ref 0.60–1.00)
Globulin: 2.8 g/dL (ref 1.9–3.7)
Glucose, Bld: 158 mg/dL — ABNORMAL HIGH (ref 65–99)
Potassium: 3.9 mmol/L (ref 3.5–5.3)
Sodium: 140 mmol/L (ref 135–146)
Total Bilirubin: 0.4 mg/dL (ref 0.2–1.2)
Total Protein: 7.3 g/dL (ref 6.1–8.1)
eGFR: 75 mL/min/{1.73_m2} (ref >=60)

## 2023-01-06 LAB — CBC WITH DIFFERENTIAL/PLATELET
Absolute Monocytes: 812 {cells}/uL (ref 200–950)
Basophils Absolute: 33 {cells}/uL (ref 0–200)
Basophils Relative: 0.5 %
Eosinophils Absolute: 172 {cells}/uL (ref 15–500)
Eosinophils Relative: 2.6 %
HCT: 41.3 % (ref 35.0–45.0)
Hemoglobin: 13 g/dL (ref 11.7–15.5)
Lymphs Abs: 2125 {cells}/uL (ref 850–3900)
MCH: 29.4 pg (ref 27.0–33.0)
MCHC: 31.5 g/dL — ABNORMAL LOW (ref 32.0–36.0)
MCV: 93.4 fL (ref 80.0–100.0)
MPV: 11.1 fL (ref 7.5–12.5)
Monocytes Relative: 12.3 %
Neutro Abs: 3458 {cells}/uL (ref 1500–7800)
Neutrophils Relative %: 52.4 %
Platelets: 235 10*3/uL (ref 140–400)
RBC: 4.42 10*6/uL (ref 3.80–5.10)
RDW: 14.1 % (ref 11.0–15.0)
Total Lymphocyte: 32.2 %
WBC: 6.6 10*3/uL (ref 3.8–10.8)

## 2023-01-10 ENCOUNTER — Other Ambulatory Visit: Payer: Self-pay

## 2023-01-10 MED ORDER — DENOSUMAB 60 MG/ML ~~LOC~~ SOSY
60.0000 mg | PREFILLED_SYRINGE | SUBCUTANEOUS | 0 refills | Status: DC
  Filled 2023-01-10: qty 1, 180d supply, fill #0

## 2023-01-10 NOTE — Telephone Encounter (Signed)
CBC and CMP on 01/05/2023 - ok to proceed with Prolia on 01/18/2023

## 2023-01-12 NOTE — Telephone Encounter (Signed)
Prolia received from pharmacy. Placed in fridge

## 2023-01-18 ENCOUNTER — Encounter: Payer: Self-pay | Admitting: Internal Medicine

## 2023-01-18 ENCOUNTER — Ambulatory Visit: Payer: 59 | Attending: Internal Medicine | Admitting: Internal Medicine

## 2023-01-18 VITALS — BP 126/74 | HR 70 | Resp 14 | Ht 65.0 in | Wt 199.0 lb

## 2023-01-18 DIAGNOSIS — M159 Polyosteoarthritis, unspecified: Secondary | ICD-10-CM

## 2023-01-18 DIAGNOSIS — M810 Age-related osteoporosis without current pathological fracture: Secondary | ICD-10-CM

## 2023-01-18 MED ORDER — DENOSUMAB 60 MG/ML ~~LOC~~ SOSY
60.0000 mg | PREFILLED_SYRINGE | Freq: Once | SUBCUTANEOUS | Status: AC
  Administered 2023-01-18: 60 mg via SUBCUTANEOUS

## 2023-01-18 NOTE — Progress Notes (Signed)
Pharmacy Note  Subjective:   Patient presents to clinic today to receive bi-annual dose of Prolia. Patient's last dose of Prolia was on 06/23/22.  Patient running a fever or have signs/symptoms of infection? No  Patient currently on antibiotics for the treatment of infection? No  Patient had fall in the last 6 months?  No  If yes, did it require medical attention? No   Patient taking calcium 1200 mg daily through diet or supplement and at least 800 units vitamin D? Yes  Objective: CMP     Component Value Date/Time   NA 140 01/05/2023 1121   NA 137 06/22/2019 0942   K 3.9 01/05/2023 1121   CL 104 01/05/2023 1121   CO2 27 01/05/2023 1121   GLUCOSE 158 (H) 01/05/2023 1121   BUN 23 01/05/2023 1121   BUN 13 06/22/2019 0942   CREATININE 0.82 01/05/2023 1121   CALCIUM 10.0 01/05/2023 1121   PROT 7.3 01/05/2023 1121   ALBUMIN 4.0 07/03/2019 1135   AST 13 01/05/2023 1121   ALT 9 01/05/2023 1121   ALKPHOS 51 07/03/2019 1135   BILITOT 0.4 01/05/2023 1121   GFRNONAA >60 07/06/2019 0434   GFRAA >60 07/06/2019 0434    CBC    Component Value Date/Time   WBC 6.6 01/05/2023 1121   RBC 4.42 01/05/2023 1121   HGB 13.0 01/05/2023 1121   HGB 12.1 06/22/2019 0942   HCT 41.3 01/05/2023 1121   HCT 36.5 06/22/2019 0942   PLT 235 01/05/2023 1121   PLT 227 06/22/2019 0942   MCV 93.4 01/05/2023 1121   MCV 94 06/22/2019 0942   MCH 29.4 01/05/2023 1121   MCHC 31.5 (L) 01/05/2023 1121   RDW 14.1 01/05/2023 1121   RDW 13.2 06/22/2019 0942   LYMPHSABS 2,125 01/05/2023 1121   EOSABS 172 01/05/2023 1121   BASOSABS 33 01/05/2023 1121    Lab Results  Component Value Date   VD25OH 67 06/07/2022    T-score: Left forearm radius -3.1 (March 2023)  Assessment/Plan:   Reviewed importance of adequate dietary intake of calcium in addition to supplementation due to risk of hypocalcemia with Prolia. She was given handout regarding dietary sources of calcium.    Patient tolerated injection  well.   Administration details as below: Administrations This Visit     denosumab (PROLIA) injection 60 mg     Admin Date 01/18/2023 Action Given Dose 60 mg Route Subcutaneous Documented By Gwenlyn Found, RPH            Patient's next Prolia dose is due on 07/19/2023.  Patient is due for updated DEXA in March 2025. Order placed for [facility].   All questions encouraged and answered.  Instructed patient to call with any further questions or concerns.   Sofie Rower, PharmD [facility] PGY-1

## 2023-01-28 ENCOUNTER — Other Ambulatory Visit (HOSPITAL_BASED_OUTPATIENT_CLINIC_OR_DEPARTMENT_OTHER): Payer: Self-pay

## 2023-02-03 ENCOUNTER — Other Ambulatory Visit: Payer: Self-pay

## 2023-06-16 ENCOUNTER — Other Ambulatory Visit: Payer: Self-pay

## 2023-06-30 ENCOUNTER — Ambulatory Visit: Payer: 59 | Admitting: Cardiology

## 2023-07-05 NOTE — Progress Notes (Signed)
 Office Visit Note  Patient: Cathy Haney             Date of Birth: 1951-01-07           MRN: [id number]             PCP: Ardella Beaver, MD Referring: Ardella Beaver, MD Visit Date: 07/18/2023   Subjective:  Follow-up   Discussed the use of AI scribe software for clinical note transcription with the patient, who gave verbal consent to proceed.  History of Present Illness   Cathy Haney is a 73 y.o. female here for follow up for osteoporosis on prolia  and with osteoarthritis of multiple joints.  She has a history of carpal tunnel syndrome with previous surgical intervention on both arms. There is a hard, itchy area on her arm where the surgery was performed. She experiences arthritis, particularly in her left thumb, but no significant joint pain, swelling, or balance problems that are worse than usual.  She experiences trigger finger symptoms, particularly in her right index finger and left middle finger. She describes having to 'pull on something' and sometimes the finger will 'lock down,' requiring her to push it back up quickly. She performs hand stretching exercises every night to manage the symptoms.  She has received Prolia  injections for osteoporosis and is due for a bone density test to evaluate the effectiveness of the treatment. She recalls having a bone density test earlier this year.  No recent serious illnesses, regular cough, eye irritation, or rashes. She uses antihistamines as needed during pollen season, but not frequently.   Previous HPI 01/18/2023 Cathy Haney is a 73 y.o. female here for follow up for osteoporosis on prolia  and with osteoarthritis of multiple joints.  Overall is feeling well has not suffered any new falls or injury.  She is not able to do a lot of weightbearing exercise due to significant knee arthritis treated with viscosupplementation injections she usually relies on a walker or leaning on a cart while away from home.  Is on gabapentin  300 mg  5 times daily not requiring other prescription medicines for arthritis.  She had recent blood count metabolic panel labs drawn which were fine.   Previous HPI 06/23/2022 Cathy Haney is a 73 y.o. female here for follow up for osteoporosis now on Prolia  treatment with dose scheduled today also to discuss some osteoarthritis symptoms.  Prolia  treatment second dose today she did not have any complications with the first medication start.  Has not sustained any new falls over the past 6 months.  She is taking supplemental calcium  and vitamin D  as directed.  Recent labs were obtained including metabolic panel and vitamin D  with calcium  estimated GFR and vitamin D  in goal range.  She is using a rolling walker when walking outside of the home most times.  Has some difficulty with transporting this and just uses the cart or buggy as assistance most times while shopping.  She is to continue to follow-up with orthopedic surgery as discussed the treatment options for advanced knee osteoarthritis including possible viscosupplementation injections due to limited benefit duration with the steroid shots but recommending against surgeries if possible.   She is here today with her sister.   Previous HPI 12/07/21 Cathy Haney is a 73 y.o. female here for osteoporosis currently treated with fosamax but with lack of improvement or possible progressive decline on repeat bone densitometry.  She is not exactly sure how long she is taking the Fosamax  medication but has been for several years and reports good adherence to this.  She has no history of any bone fractures.  She has hypothyroidism stable on thyroid replacement medication and is a former cigarette smoker.  She denies any history of kidney stones.  She denies any long-term use of corticosteroids.  She has not fallen within the past 2 years.  She is taking a vitamin D3 supplement daily appears to be 2000 units. Besides the osteoporosis she does also complain of some  joint pain symptoms as well.  She has chronic low back pain that is aggravated with prolonged standing or walking and radiation of symptoms into the left leg.  This occurs within less than a few 100 feet.  Symptoms are alleviated by sitting or standing with a support for forward bending such as a counter walker or cart.  She denies any numbness or loss of sensation in her legs or feet. She does experience numbness and tingling symptoms in the left hand.  She had similar problems in her right hand with previous carpal tunnel release surgery to resolve this.  She has been performing some of the range of motion exercises for this.   Her daughter is present at our visit today.   DEXA reviewed DualFemur Total Left 06/11/21            -1.7      0.798 03/19/20          -1.3      0.844 02/27/2018        -1.7      0.793 02/25/16          -2.0      0.755 01/19/02          -1.6      0.818   DualFemur Mean 06/11/21            -1.4      0.837 03/19/20          -1.1      0.868 02/27/18            -1.5      0.823 02/25/16          -1.9      0.764 01/19/02          -1.4      0.830   Left forearm Radius 06/11/21            -3.1      0.606 03/19/20          -2.9      0.626 02/27/18            -2.9      0.621   Review of Systems  Constitutional:  Negative for fatigue.  HENT:  Negative for mouth sores and mouth dryness.   Eyes:  Negative for dryness.  Respiratory:  Positive for shortness of breath.   Cardiovascular:  Negative for chest pain and palpitations.  Gastrointestinal:  Positive for diarrhea. Negative for blood in stool and constipation.  Endocrine: Positive for increased urination.  Genitourinary:  Negative for involuntary urination.  Musculoskeletal:  Positive for joint pain and joint pain. Negative for gait problem, joint swelling, myalgias, muscle weakness, morning stiffness, muscle tenderness and myalgias.  Skin:  Negative for color change, rash, hair loss and sensitivity to sunlight.   Allergic/Immunologic: Negative for susceptible to infections.  Neurological:  Negative for dizziness and headaches.  Hematological:  Negative for swollen glands.  Psychiatric/Behavioral:  Negative for depressed mood and  sleep disturbance. The patient is not nervous/anxious.     PMFS History:  Patient Active Problem List   Diagnosis Date Noted   Generalized osteoarthritis of multiple sites 06/23/2022   Age-related osteoporosis without current pathological fracture 12/07/2021   Vitamin D  deficiency 12/07/2021   Pre-diabetes    Pneumonia    Hypothyroidism    Dyspnea    Depression    Arthritis    Anxiety    Anemia    Allergy    Acquired hypothyroidism 07/06/2019   CAD (coronary artery disease) 06/27/2019   Coronary artery calcification seen on CT scan    Shortness of breath 06/19/2019   Mixed hyperlipidemia 06/19/2019   Prediabetes 06/19/2019   Obesity (BMI 30-39.9) 06/19/2019   AAA (abdominal aortic aneurysm) without rupture (HCC) 06/19/2019   Preoperative cardiovascular examination 06/01/2017   Diverticulitis 05/30/2017   Thyroid disease 05/30/2017   Benign hypertension 05/30/2017   Aortic aneurysm of unspecified site, without rupture (HCC) 05/30/2017   Benign essential hypertension 05/30/2017   Abdominal aortic aneurysm (HCC) 05/27/2017    Past Medical History:  Diagnosis Date   AAA (abdominal aortic aneurysm) without rupture (HCC) 06/19/2019   Abdominal aortic aneurysm (HCC) 05/27/2017   Acquired hypothyroidism 07/06/2019   Allergy    Anemia    during pregnancy   Anxiety    hx   Aortic aneurysm of unspecified site, without rupture (HCC) 05/30/2017   Arthritis    Benign essential hypertension 05/30/2017   Benign hypertension 05/30/2017   CAD (coronary artery disease) 06/27/2019   Coronary artery calcification seen on CT scan    Depression    hx has meds as needed   Diverticulitis    Dyspnea    Hypothyroidism    Aquired   Mixed hyperlipidemia    Obesity (BMI 30-39.9)  06/19/2019   Pneumonia    in the past   Pre-diabetes    diet controlled, doesnt eat sweets   Prediabetes    Preoperative cardiovascular examination 06/01/2017   Shortness of breath 06/19/2019   Thyroid disease     Family History  Problem Relation Age of Onset   Heart disease Mother    Heart disease Father    Diabetes Father    Colon cancer Sister    Arthritis Brother    Aneurysm Brother    Heart attack Brother    Diabetes Brother    Heart disease Brother    Diverticulitis Son    Past Surgical History:  Procedure Laterality Date   ABDOMINAL AORTA STENT  07/05/2019   ABDOMINAL AORTIC ENDOVASCULAR STENT GRAFT (N/A Groin)    ABDOMINAL AORTIC ENDOVASCULAR STENT GRAFT N/A 07/05/2019   Procedure: ABDOMINAL AORTIC ENDOVASCULAR STENT GRAFT;  Surgeon: Adine Hoof, MD;  Location: [facility] OR;  Service: Vascular;  Laterality: N/A;   BACK SURGERY     khyphoplasty   CARPAL TUNNEL RELEASE     CARPAL TUNNEL RELEASE Left 11/26/2022   COLONOSCOPY     ENDARTERECTOMY FEMORAL  07/05/2019   Procedure: Right Endarterectomy Femoral;  Surgeon: Adine Hoof, MD;  Location: [facility] OR;  Service: Vascular;;   EXCISION OF SKIN LESION     left arm   LEFT HEART CATH AND CORONARY ANGIOGRAPHY N/A 06/27/2019   Procedure: LEFT HEART CATH AND CORONARY ANGIOGRAPHY;  Surgeon: Arty Binning, MD;  Location: [facility] INVASIVE CV LAB;  Service: Cardiovascular;  Laterality: N/A;   TOE SURGERY     nails removed   TONSILLECTOMY     Social History   Social  History Narrative   Not on file    There is no immunization history on file for this patient.   Objective: Vital Signs: BP 135/73 (BP Location: Left Arm, Patient Position: Sitting, Cuff Size: Large)   Pulse 70   Resp 14   Ht 5\' 5"  (1.651 m)   Wt 196 lb (88.9 kg)   BMI 32.62 kg/m    Physical Exam Cardiovascular:     Rate and Rhythm: Normal rate and regular rhythm.  Pulmonary:     Effort: Pulmonary effort is normal.     Breath sounds:  Normal breath sounds.  Skin:    General: Skin is warm and dry.  Neurological:     Mental Status: She is alert.  Psychiatric:        Mood and Affect: Mood normal.     Musculoskeletal Exam:  Shoulders full ROM no tenderness or swelling Elbows full ROM no tenderness or swelling Wrists full ROM no tenderness or swelling Fingers full ROM no tenderness or swelling Heberden's nodes present on both hands worse distally No paraspinal tenderness to palpation over upper and lower back Hip normal internal and external rotation without pain, no tenderness to lateral hip palpation Knees full ROM minimal tenderness to pressure and no palpable effusions, loud patellofemoral crepitus bilaterally  Investigation: No additional findings.  Imaging: No results found.  Recent Labs: Lab Results  Component Value Date   WBC 6.8 07/11/2023   HGB 13.0 07/11/2023   PLT 242 07/11/2023   NA 139 07/11/2023   K 4.2 07/11/2023   CL 106 07/11/2023   CO2 24 07/11/2023   GLUCOSE 111 (H) 07/11/2023   BUN 18 07/11/2023   CREATININE 0.91 07/11/2023   BILITOT 0.4 07/11/2023   ALKPHOS 51 07/03/2019   AST 15 07/11/2023   ALT 12 07/11/2023   PROT 7.0 07/11/2023   ALBUMIN 4.0 07/03/2019   CALCIUM  9.8 07/11/2023   GFRAA >60 07/06/2019    Speciality Comments: No specialty comments available.  Procedures:  No procedures performed Allergies: Patient has no known allergies.   Assessment / Plan:     Visit Diagnoses: Age-related osteoporosis without current pathological fracture - Plan to continue with the Prolia  injections. Continuing vitamin D  and calcium  supplementation - Plan: denosumab  (PROLIA ) injection 60 mg Receiving Prolia  injections; bone density test planned to monitor treatment. - Administer Prolia  injection. - Order bone density test to assess the effectiveness of Prolia  treatment. [facility])  Arthritis Chronic condition managed with ongoing treatment.  Trigger finger Nodule on flexor  tendon of right index and left middle fingers, not causing significant pain or impairment. - Provide information on stretching exercises for the hand. - Advise on the use of a splint at night if triggering becomes bothersome.  Carpal tunnel syndrome Post-surgical site on left arm with hard, itchy area. - Continue using B3 cream on the affected area.  Follow-up Necessary to monitor osteoporosis treatment and other conditions. - Schedule follow-up appointment after bone density test results are available.   Orders: No orders of the defined types were placed in this encounter.  Meds ordered this encounter  Medications   denosumab  (PROLIA ) injection 60 mg    Patient is enrolled in [facility]S program for this medication and I have provided a copy of the Prolia  Medication Guide and Patient Brochure.:   Yes    I have reviewed with the patient the information in the Prolia  Medication Guide and Patient Counseling Chart including the serious risks of Prolia  and symptoms of each  risk.:   Yes    I have advised the patient to seek medical attention if they have signs or symptoms of any of the serious risks.:   Yes     Follow-Up Instructions: Return in about 6 months (around 01/17/2024) for OP on prolia  f/u 6mos.   Matt Song, MD  Note - This record has been created using [employer].  Chart creation errors have been sought, but may not always  have been located. Such creation errors do not reflect on  the standard of medical care.

## 2023-07-06 ENCOUNTER — Other Ambulatory Visit: Payer: Self-pay | Admitting: Pharmacy Technician

## 2023-07-06 ENCOUNTER — Other Ambulatory Visit: Payer: Self-pay

## 2023-07-06 ENCOUNTER — Telehealth: Payer: Self-pay | Admitting: Pharmacist

## 2023-07-06 ENCOUNTER — Other Ambulatory Visit (HOSPITAL_COMMUNITY): Payer: Self-pay

## 2023-07-06 DIAGNOSIS — M810 Age-related osteoporosis without current pathological fracture: Secondary | ICD-10-CM

## 2023-07-06 DIAGNOSIS — Z79899 Other long term (current) drug therapy: Secondary | ICD-10-CM

## 2023-07-06 MED ORDER — DENOSUMAB 60 MG/ML ~~LOC~~ SOSY
60.0000 mg | PREFILLED_SYRINGE | SUBCUTANEOUS | 0 refills | Status: DC
  Filled 2023-07-06: qty 1, 180d supply, fill #0

## 2023-07-06 MED ORDER — DENOSUMAB 60 MG/ML ~~LOC~~ SOSY
60.0000 mg | PREFILLED_SYRINGE | Freq: Once | SUBCUTANEOUS | Status: AC
Start: 2023-07-18 — End: 2023-07-18
  Administered 2023-07-18: 60 mg via SUBCUTANEOUS

## 2023-07-06 NOTE — Progress Notes (Signed)
 Specialty Pharmacy Refill Coordination Note  Cathy Haney is a 73 y.o. female assessed today regarding refills of clinic administered specialty medication(s) Denosumab [facility] Seats)   Clinic requested Courier to Provider Office   Delivery date: 07/13/23   Verified address: [facility]: [address], [place] [address].   Medication will be filled on 07/12/23.  Appt 07/18/23

## 2023-07-06 NOTE — Telephone Encounter (Signed)
 Prolia due 07/18/23 at [facility] with Dr. Dimple Casey. Rx sent to [facility]  Per PCP,Dr. Laymond Purser' office, labs can be faxed with account #. Appt is not needed. Spoke with patient and she will go with her sister   Labs faxed to PCP    Fax: [phone]  Chesley Mires, PharmD, MPH, BCPS, CPP Clinical Pharmacist (Rheumatology and Pulmonology)

## 2023-07-12 LAB — COMPREHENSIVE METABOLIC PANEL WITH GFR
AG Ratio: 1.8 (calc) (ref 1.0–2.5)
ALT: 12 U/L (ref 6–29)
AST: 15 U/L (ref 10–35)
Albumin: 4.5 g/dL (ref 3.6–5.1)
Alkaline phosphatase (APISO): 37 U/L (ref 37–153)
BUN: 18 mg/dL (ref 7–25)
CO2: 24 mmol/L (ref 20–32)
Calcium: 9.8 mg/dL (ref 8.6–10.4)
Chloride: 106 mmol/L (ref 98–110)
Creat: 0.91 mg/dL (ref 0.60–1.00)
Globulin: 2.5 g/dL (ref 1.9–3.7)
Glucose, Bld: 111 mg/dL — ABNORMAL HIGH (ref 65–99)
Potassium: 4.2 mmol/L (ref 3.5–5.3)
Sodium: 139 mmol/L (ref 135–146)
Total Bilirubin: 0.4 mg/dL (ref 0.2–1.2)
Total Protein: 7 g/dL (ref 6.1–8.1)
eGFR: 67 mL/min/{1.73_m2} (ref >=60)

## 2023-07-12 LAB — CBC WITH DIFFERENTIAL/PLATELET
Absolute Lymphocytes: 2666 {cells}/uL (ref 850–3900)
Absolute Monocytes: 721 {cells}/uL (ref 200–950)
Basophils Absolute: 41 {cells}/uL (ref 0–200)
Basophils Relative: 0.6 %
Eosinophils Absolute: 184 {cells}/uL (ref 15–500)
Eosinophils Relative: 2.7 %
HCT: 39.4 % (ref 35.0–45.0)
Hemoglobin: 13 g/dL (ref 11.7–15.5)
MCH: 29.5 pg (ref 27.0–33.0)
MCHC: 33 g/dL (ref 32.0–36.0)
MCV: 89.3 fL (ref 80.0–100.0)
MPV: 10.2 fL (ref 7.5–12.5)
Monocytes Relative: 10.6 %
Neutro Abs: 3189 {cells}/uL (ref 1500–7800)
Neutrophils Relative %: 46.9 %
Platelets: 242 10*3/uL (ref 140–400)
RBC: 4.41 10*6/uL (ref 3.80–5.10)
RDW: 14.4 % (ref 11.0–15.0)
Total Lymphocyte: 39.2 %
WBC: 6.8 10*3/uL (ref 3.8–10.8)

## 2023-07-12 LAB — VITAMIN D 25 HYDROXY (VIT D DEFICIENCY, FRACTURES): Vit D, 25-Hydroxy: 83 ng/mL (ref 30–100)

## 2023-07-12 NOTE — Progress Notes (Signed)
 Blood counts and calcium and kidney function are normal.  Vitamin D is normal at 83.  No problem for continuing Prolia but she will be scheduled for next week.

## 2023-07-13 NOTE — Telephone Encounter (Signed)
Prolia received from pharmacy. Placed in fridge

## 2023-07-18 ENCOUNTER — Encounter: Payer: Self-pay | Admitting: Internal Medicine

## 2023-07-18 ENCOUNTER — Ambulatory Visit: Payer: 59 | Attending: Internal Medicine | Admitting: Internal Medicine

## 2023-07-18 VITALS — BP 135/73 | HR 70 | Resp 14 | Ht 65.0 in | Wt 196.0 lb

## 2023-07-18 DIAGNOSIS — M159 Polyosteoarthritis, unspecified: Secondary | ICD-10-CM

## 2023-07-18 DIAGNOSIS — M810 Age-related osteoporosis without current pathological fracture: Secondary | ICD-10-CM | POA: Diagnosis not present

## 2023-07-18 NOTE — Progress Notes (Signed)
 Subjective:   Patient presents to clinic today to receive bi-annual dose of Prolia .  Patient running a fever or have signs/symptoms of infection? No  Patient currently on antibiotics for the treatment of infection? No  Patient had fall in the last 6 months?  No  If yes, did it require medical attention? No   Patient taking calcium  1200 mg daily through diet or supplement and at least 800 units vitamin D ? Yes  Objective: CMP     Component Value Date/Time   NA 139 07/11/2023 0937   NA 137 06/22/2019 0942   K 4.2 07/11/2023 0937   CL 106 07/11/2023 0937   CO2 24 07/11/2023 0937   GLUCOSE 111 (H) 07/11/2023 0937   BUN 18 07/11/2023 0937   BUN 13 06/22/2019 0942   CREATININE 0.91 07/11/2023 0937   CALCIUM  9.8 07/11/2023 0937   PROT 7.0 07/11/2023 0937   ALBUMIN 4.0 07/03/2019 1135   AST 15 07/11/2023 0937   ALT 12 07/11/2023 0937   ALKPHOS 51 07/03/2019 1135   BILITOT 0.4 07/11/2023 0937   GFRNONAA >60 07/06/2019 0434   GFRAA >60 07/06/2019 0434    CBC    Component Value Date/Time   WBC 6.8 07/11/2023 0937   RBC 4.41 07/11/2023 0937   HGB 13.0 07/11/2023 0937   HGB 12.1 06/22/2019 0942   HCT 39.4 07/11/2023 0937   HCT 36.5 06/22/2019 0942   PLT 242 07/11/2023 0937   PLT 227 06/22/2019 0942   MCV 89.3 07/11/2023 0937   MCV 94 06/22/2019 0942   MCH 29.5 07/11/2023 0937   MCHC 33.0 07/11/2023 0937   RDW 14.4 07/11/2023 0937   RDW 13.2 06/22/2019 0942   LYMPHSABS 2,125 01/05/2023 1121   EOSABS 184 07/11/2023 0937   BASOSABS 41 07/11/2023 0937    Lab Results  Component Value Date   VD25OH 83 07/11/2023    Assessment/Plan:   Administrations This Visit     denosumab  (PROLIA ) injection 60 mg     Admin Date 07/18/2023 Action Given Dose 60 mg Route Subcutaneous Documented By Adrianne Horn, LPN             Patient tolerated injection well.   All questions encouraged and answered.  Instructed patient to call with any further questions or  concerns.

## 2023-07-19 MED ORDER — DENOSUMAB 60 MG/ML ~~LOC~~ SOSY: 60.0000 mg | PREFILLED_SYRINGE | SUBCUTANEOUS | Status: DC

## 2023-08-25 ENCOUNTER — Encounter: Payer: Self-pay | Admitting: Cardiology

## 2023-09-07 ENCOUNTER — Other Ambulatory Visit: Payer: Self-pay

## 2023-09-08 ENCOUNTER — Ambulatory Visit

## 2023-09-08 VITALS — BP 140/72 | HR 74 | Ht 64.6 in | Wt 196.4 lb

## 2023-09-08 DIAGNOSIS — E782 Mixed hyperlipidemia: Secondary | ICD-10-CM

## 2023-09-08 DIAGNOSIS — I10 Essential (primary) hypertension: Secondary | ICD-10-CM | POA: Diagnosis not present

## 2023-09-08 DIAGNOSIS — I2510 Atherosclerotic heart disease of native coronary artery without angina pectoris: Secondary | ICD-10-CM | POA: Diagnosis not present

## 2023-09-08 NOTE — Progress Notes (Signed)
 Cardiology Consultation:    Date:  09/08/2023   ID:  KAYDI KLEY, DOB 1950/04/30, MRN [id number]  PCP:  Ardella Beaver, MD  Cardiologist:  Angelena Kells, MD   Referring MD: Ardella Beaver, MD   No chief complaint on file.    ASSESSMENT AND PLAN:   Cathy Haney 73 year old woman with history of history of coronary artery disease initially identified on cardiac CT scan done for preop workup prior to AAA surgery, followed up with cardiac cath March 2021 showing moderate to severe triple-vessel disease associated with proximal LAD aneurysmal segment recommended to be treated medically, abdominal aortic aneurysm s/p EVAR April 2021, diabetes mellitus, hypertension, hyperlipidemia, hypothyroidism, chronic back pain and sciatica, peripheral vascular disease left lower extremity with stable claudication symptoms.   Here for follow-up visit and doing well. Problem List Items Addressed This Visit     Mixed hyperlipidemia   Last lipid panel available is from February 2024, good control with LDL 57, HDL 53, triglycerides 138 and total cholesterol 132. Continue with rosuvastatin  40 mg once daily.        CAD (coronary artery disease) - Primary   No prior history of MI, CHF, CVA. Preop cardiac workup prior to abdominal aneurysmal surgery with cardiac CT identified triple-vessel disease. Cardiac cath March 2021 moderate to severe triple-vessel disease associated with proximal LAD aneurysmal segment recommended to be treated medically given lack of symptoms.  Remains on dual antiplatelet therapy with aspirin  81 mg once daily and Plavix  75 mg once daily, tolerating well. No strong indication from cardiac standpoint for dual antiplatelet therapy, appears to be in the setting of ongoing peripheral vascular disease.  Continue with rosuvastatin  40 mg once daily.  She is aware to watch out for any progressive symptoms suggestive of angina such as chest pain or shortness of breath, increasing  fatigue and notify us  immediately.      Relevant Orders   EKG 12-Lead (Completed)   Benign essential hypertension   Well-controlled. Continue with carvedilol  6.25 mg twice daily and lisinopril  10 mg once daily. Target blood pressure below 130/80 mmHg.        Return to clinic for follow-up with us  in 1 year or as needed  History of Present Illness:    Cathy Haney is a 73 y.o. female who is being seen today for a follow-up visit. PCP is Sistasis, Lindajo Resides, MD. Last visit with our office was 04/16/2022 with Dr. Sandee Crook. Also follows up with vascular surgeon annually regarding history of abdominal aortic aneurysm repair and peripheral vascular disease.  Pleasant woman here for the visit today by herself.  Lives with her son at home.  Stays busy with day-to-day activities and also babysits for her granddaughters child.  Has history of coronary artery disease initially identified on cardiac CT scan done for preop workup prior to AAA surgery, followed up with cardiac cath March 2021 showing moderate to severe triple-vessel disease associated with proximal LAD aneurysmal segment recommended to be treated medically, abdominal aortic aneurysm s/p EVAR April 2021, diabetes mellitus, hypertension, hyperlipidemia, hypothyroidism, chronic back pain and sciatica, peripheral vascular disease left lower extremity with claudication.  Mentions overall has been doing well no cardiac symptoms such as chest pain, shortness of breath, lower extremity edema.  Denies any orthopnea or paroxysmal nocturnal dyspnea.  No palpitations, lightheadedness or syncopal episodes.  Does report back pain which has been chronic which starts up after walking for certain distance and at times associated with pain radiating down the left  leg but also reports claudication-like symptoms left lower extremity after walking certain distance and eases up rapidly after she rests.  EKG in the clinic today shows sinus rhythm heart rate  74/min, PR interval 148 ms, QRS duration 86 ms, QTc 437 ms.  No significant changes suggestive of ischemia.  Blood work recently from 07/11/2023 hemoglobin 13, hematocrit 39.4, platelets 242, WBC 6.8. CMP with BUN 18, creatinine 0.91, EGFR 67. Sodium 139 and potassium 4.2. Normal transaminases and alkaline phosphatase.  Last lipid panel available to review is from February 2024 with total cholesterol 132, HDL 53, LDL 57, triglycerides 138.  Past Medical History:  Diagnosis Date   AAA (abdominal aortic aneurysm) without rupture (HCC) 06/19/2019   Abdominal aortic aneurysm (HCC) 05/27/2017   Acquired hypothyroidism 07/06/2019   Age-related osteoporosis without current pathological fracture 12/07/2021   Allergy    Anemia    during pregnancy   Anxiety    hx   Aortic aneurysm of unspecified site, without rupture (HCC) 05/30/2017   Arthritis    Benign essential hypertension 05/30/2017   Benign hypertension 05/30/2017   CAD (coronary artery disease) 06/27/2019   Coronary artery calcification seen on CT scan    Depression    hx has meds as needed   Diverticulitis    Dyspnea    Generalized osteoarthritis of multiple sites 06/23/2022   Hypothyroidism    Aquired   Mixed hyperlipidemia    Obesity (BMI 30-39.9) 06/19/2019   Pneumonia    in the past   Pre-diabetes    diet controlled, doesnt eat sweets   Prediabetes    Preoperative cardiovascular examination 06/01/2017   Shortness of breath 06/19/2019   Thyroid disease    Vitamin D  deficiency 12/07/2021    Past Surgical History:  Procedure Laterality Date   ABDOMINAL AORTA STENT  07/05/2019   ABDOMINAL AORTIC ENDOVASCULAR STENT GRAFT (N/A Groin)    ABDOMINAL AORTIC ENDOVASCULAR STENT GRAFT N/A 07/05/2019   Procedure: ABDOMINAL AORTIC ENDOVASCULAR STENT GRAFT;  Surgeon: Adine Hoof, MD;  Location: [facility] OR;  Service: Vascular;  Laterality: N/A;   BACK SURGERY     khyphoplasty   CARPAL TUNNEL RELEASE     CARPAL TUNNEL  RELEASE Left 11/26/2022   COLONOSCOPY     ENDARTERECTOMY FEMORAL  07/05/2019   Procedure: Right Endarterectomy Femoral;  Surgeon: Adine Hoof, MD;  Location: [facility] OR;  Service: Vascular;;   EXCISION OF SKIN LESION     left arm   LEFT HEART CATH AND CORONARY ANGIOGRAPHY N/A 06/27/2019   Procedure: LEFT HEART CATH AND CORONARY ANGIOGRAPHY;  Surgeon: Arty Binning, MD;  Location: [facility] INVASIVE CV LAB;  Service: Cardiovascular;  Laterality: N/A;   TOE SURGERY     nails removed   TONSILLECTOMY      Current Medications: Current Meds  Medication Sig   albuterol  (VENTOLIN  HFA) 108 (90 Base) MCG/ACT inhaler Inhale into the lungs as needed for wheezing or shortness of breath.    aspirin  EC 81 MG tablet Take 81 mg by mouth daily.   Blood Glucose Monitoring Suppl (ACCU-CHEK GUIDE ME) w/Device KIT USE AS DIRECTED TO CHECK BLOOD SUGAR   Calcium  Carb-Cholecalciferol (CALCIUM  600/VITAMIN D3 PO) Take 1 tablet by mouth daily.   carvedilol  (COREG ) 6.25 MG tablet Take 6.25 mg by mouth 2 (two) times daily with a meal.    Cholecalciferol (VITAMIN D3) 50 MCG (2000 UT) TABS Take 2,000 Units by mouth every evening.   clopidogrel  (PLAVIX ) 75 MG tablet TAKE 1 TABLET BY  MOUTH ONCE DAILY AT 6 AM   denosumab  (PROLIA ) 60 MG/ML SOSY injection Inject 60 mg into the skin every 6 (six) months. Courier to rheum: [address], [address], [place] [address]. Appt 07/18/23   FARXIGA 10 MG TABS tablet Take 1 tablet every day by oral route with meals.   Fish Oil-Cholecalciferol (OMEGA-3 + VITAMIN D3 PO) Take 1 tablet by mouth daily at 12 noon.   fluticasone (FLONASE) 50 MCG/ACT nasal spray Place 1 spray into both nostrils daily as needed for rhinitis (runny nose/allergies.).   gabapentin  (NEURONTIN ) 300 MG capsule Take 300 mg by mouth 5 (five) times daily.    glucose blood test strip Accu-Chek Aviva Plus test strips  Use to check BS daily.   levothyroxine  (SYNTHROID , LEVOTHROID) 125 MCG tablet Take 125 mcg by  mouth daily before breakfast.   LINZESS 72 MCG capsule Take 72 mcg by mouth daily. (Patient taking differently: Take 72 mcg by mouth as needed (constipation).)   lisinopril  (PRINIVIL ,ZESTRIL ) 10 MG tablet Take 10 mg by mouth daily.   metFORMIN (GLUCOPHAGE) 500 MG tablet Take 500 mg by mouth daily. (Patient taking differently: Take 500 mg by mouth 2 (two) times daily with a meal.)   MOUNJARO 2.5 MG/0.5ML Pen Inject 2.5 mg into the skin once a week.   Multiple Vitamins-Minerals (PRESERVISION AREDS 2 PO) Take 1 tablet by mouth in the morning and at bedtime.   rosuvastatin  (CRESTOR ) 40 MG tablet Take 1 tablet (40 mg total) by mouth daily.   Current Facility-Administered Medications for the 09/08/23 encounter (Office Visit) with Myan Suit, Daymon Evans, MD  Medication   [START ON 01/15/2024] denosumab  (PROLIA ) injection 60 mg     Allergies:   Patient has no known allergies.   Social History   Socioeconomic History   Marital status: Widowed    Spouse name: Not on file   Number of children: Not on file   Years of education: Not on file   Highest education level: Not on file  Occupational History   Not on file  Tobacco Use   Smoking status: Former    Current packs/day: 0.00    Average packs/day: 0.5 packs/day for 50.0 years (25.0 ttl pk-yrs)    Types: Cigarettes    Start date: 03/29/1969    Quit date: 03/30/2019    Years since quitting: 4.4    Passive exposure: Never   Smokeless tobacco: Never  Vaping Use   Vaping status: Never Used  Substance and Sexual Activity   Alcohol use: No   Drug use: No   Sexual activity: Not Currently  Other Topics Concern   Not on file  Social History Narrative   Not on file   Social Drivers of Health   Financial Resource Strain: Not on file  Food Insecurity: Not on file  Transportation Needs: Not on file  Physical Activity: Not on file  Stress: Not on file  Social Connections: Not on file     Family History: The patient's family history includes  Aneurysm in her brother; Arthritis in her brother; Colon cancer in her sister; Diabetes in her brother and father; Diverticulitis in her son; Heart attack in her brother; Heart disease in her brother, father, and mother. ROS:   Please see the history of present illness.    All 14 point review of systems negative except as described per history of present illness.  EKGs/Labs/Other Studies Reviewed:    The following studies were reviewed today:   EKG:  EKG Interpretation Date/Time:  Thursday September 08 2023 09:08:35 EDT Ventricular Rate:  74 PR Interval:  148 QRS Duration:  86 QT Interval:  394 QTC Calculation: 437 R Axis:   81  Text Interpretation: Normal sinus rhythm Nonspecific ST abnormality Abnormal ECG No previous ECGs available Confirmed by Bertha Broad reddy [phone]) on 09/08/2023 9:14:11 AM    Recent Labs: 07/11/2023: ALT 12; BUN 18; Creat 0.91; Hemoglobin 13.0; Platelets 242; Potassium 4.2; Sodium 139  Recent Lipid Panel No results found for: CHOL, TRIG, HDL, CHOLHDL, VLDL, LDLCALC, LDLDIRECT  Physical Exam:    VS:  BP (!) 140/72   Pulse 74   Ht 5' 4.6 (1.641 m)   Wt 196 lb 6.4 oz (89.1 kg)   SpO2 94%   BMI 33.09 kg/m     Wt Readings from Last 3 Encounters:  09/08/23 196 lb 6.4 oz (89.1 kg)  07/18/23 196 lb (88.9 kg)  01/18/23 199 lb (90.3 kg)     GENERAL:  Well nourished, well developed in no acute distress NECK: No JVD; No carotid bruits CARDIAC: RRR, S1 and S2 present, no murmurs, no rubs, no gallops CHEST:  Clear to auscultation without rales, wheezing or rhonchi  Extremities: No pitting pedal edema. Pulses bilaterally symmetric with radial 2+ and dorsalis pedis 2+ NEUROLOGIC:  Alert and oriented x 3  Medication Adjustments/Labs and Tests Ordered: Current medicines are reviewed at length with the patient today.  Concerns regarding medicines are outlined above.  Orders Placed This Encounter  Procedures   EKG 12-Lead   No orders of the  defined types were placed in this encounter.   Signed, Lura Sallies, MD, MPH, [facility]. 09/08/2023 9:37 AM    [facility]

## 2023-09-08 NOTE — Assessment & Plan Note (Signed)
 Well-controlled. Continue with carvedilol  6.25 mg twice daily and lisinopril  10 mg once daily. Target blood pressure below 130/80 mmHg.

## 2023-09-08 NOTE — Assessment & Plan Note (Addendum)
 No prior history of MI, CHF, CVA. Preop cardiac workup prior to abdominal aneurysmal surgery with cardiac CT identified triple-vessel disease. Cardiac cath March 2021 moderate to severe triple-vessel disease associated with proximal LAD aneurysmal segment recommended to be treated medically given lack of symptoms.  Remains on dual antiplatelet therapy with aspirin  81 mg once daily and Plavix  75 mg once daily, tolerating well. No strong indication from cardiac standpoint for dual antiplatelet therapy, appears to be in the setting of ongoing peripheral vascular disease.  Continue with rosuvastatin  40 mg once daily.  She is aware to watch out for any progressive symptoms suggestive of angina such as chest pain or shortness of breath, increasing fatigue and notify us  immediately.

## 2023-09-08 NOTE — Patient Instructions (Addendum)
 Medication Instructions:  Your physician recommends that you continue on your current medications as directed. Please refer to the Current Medication list given to you today.  *If you need a refill on your cardiac medications before your next appointment, please call your pharmacy*  Lab Work: None If you have labs (blood work) drawn today and your tests are completely normal, you will receive your results only by: MyChart Message (if you have MyChart) OR A paper copy in the mail If you have any lab test that is abnormal or we need to change your treatment, we will call you to review the results.  Testing/Procedures: None  Follow-Up: At [facility], you and your health needs are our priority.  As part of our continuing mission to provide you with exceptional heart care, our providers are all part of one team.  This team includes your primary Cardiologist (physician) and Advanced Practice Providers or APPs (Physician Assistants and Nurse Practitioners) who all work together to provide you with the care you need, when you need it.  Your next appointment:   1 year(s)  Provider:   Bertha Broad, MD    We recommend signing up for the patient portal called MyChart.  Sign up information is provided on this After Visit Summary.  MyChart is used to connect with patients for Virtual Visits (Telemedicine).  Patients are able to view lab/test results, encounter notes, upcoming appointments, etc.  Non-urgent messages can be sent to your provider as well.   To learn more about what you can do with MyChart, go to [email or web address].   Other Instructions Warning Signs of a Stroke A stroke is a medical emergency. It should be treated right away. A stroke happens when there is not enough blood flow to the brain. A stroke can lead to brain damage and death. But if a person gets treated right away, they have a better chance of surviving and recovering. It is very important to recognize  the symptoms of a stroke. What types of strokes are there? There are two main types of strokes: Ischemic stroke. This is the most common type. It happens when a blood vessel that sends blood to the brain is blocked. Hemorrhagic stroke. This happens when there is bleeding in the brain. This may be from a blood vessel leaking or bursting. A transient ischemic attack (TIA) causes the same symptoms as a stroke. But the symptoms go away quickly and do not cause lasting damage to the brain. TIAs still need to be treated right away. They are also a sign that you are at higher risk for a stroke. What are the warning signs of a stroke? The symptoms of a stroke may differ based on the part of the brain that is involved. Symptoms often happen all of a sudden. BE FAST symptoms BE FAST is an easy way to remember the main warning signs of a stroke: B - Balance. Signs are dizziness, sudden trouble walking, or loss of balance. E - Eyes. Signs are trouble seeing or a sudden change in vision. F - Face. Signs are sudden weakness or numbness of the face, or the face or eyelid drooping on one side. A - Arms. Signs are weakness or numbness in an arm. This happens suddenly and usually on one side of the body. S - Speech. Signs are sudden trouble speaking, slurred speech, or trouble understanding what people say. T - Time. Time to call emergency services. Write down what time symptoms started. A stroke may be  happening even if only one BE FAST symptom is present. Other signs of a stroke Some less common signs of a stroke include: A sudden, severe headache with no known cause. Nausea or vomiting. Seizure. These symptoms may be an emergency. Get help right away. Call 911. Do not wait to see if the symptoms will go away. Do not drive yourself to the hospital.  This information is not intended to replace advice given to you by your health care provider. Make sure you discuss any questions you have with your  health care provider. Document Revised: 12/28/2021 Document Reviewed: 12/28/2021 Elsevier Patient Education  2024 [employer].

## 2023-09-08 NOTE — Assessment & Plan Note (Signed)
 Last lipid panel available is from February 2024, good control with LDL 57, HDL 53, triglycerides 138 and total cholesterol 132. Continue with rosuvastatin  40 mg once daily.

## 2023-09-16 ENCOUNTER — Ambulatory Visit: Admitting: Cardiology

## 2023-12-14 ENCOUNTER — Other Ambulatory Visit: Payer: Self-pay

## 2023-12-14 ENCOUNTER — Other Ambulatory Visit (HOSPITAL_COMMUNITY): Payer: Self-pay

## 2023-12-14 ENCOUNTER — Telehealth: Payer: Self-pay | Admitting: Pharmacist

## 2023-12-14 DIAGNOSIS — Z79899 Other long term (current) drug therapy: Secondary | ICD-10-CM

## 2023-12-14 DIAGNOSIS — M810 Age-related osteoporosis without current pathological fracture: Secondary | ICD-10-CM

## 2023-12-14 DIAGNOSIS — E559 Vitamin D deficiency, unspecified: Secondary | ICD-10-CM

## 2023-12-14 MED ORDER — JUBBONTI 60 MG/ML ~~LOC~~ SOSY
60.0000 mg | PREFILLED_SYRINGE | SUBCUTANEOUS | 0 refills | Status: AC
  Filled 2023-12-14: qty 1, fill #0
  Filled 2023-12-28: qty 1, 180d supply, fill #0

## 2023-12-14 NOTE — Telephone Encounter (Signed)
 Patient due for Prolia  on 01/14/2024. She has f/u on 01/17/2024 with Dr. Jeannetta.   Insurance no longer covers Prolia . Jubbonti  is preferred biosimilar. Test claim shows copay of $0.  Rx sent to [facility] to be couriered to office before appt.  Will need to coordinate labs with Dr. Marelyn' office before appt on 12/18/23  Torrian Canion, PharmD, MPH, BCPS, CPP Clinical Pharmacist [facility])

## 2023-12-15 ENCOUNTER — Other Ambulatory Visit: Payer: Self-pay

## 2023-12-15 DIAGNOSIS — I714 Abdominal aortic aneurysm, without rupture, unspecified: Secondary | ICD-10-CM

## 2023-12-15 DIAGNOSIS — I739 Peripheral vascular disease, unspecified: Secondary | ICD-10-CM

## 2023-12-15 NOTE — Telephone Encounter (Signed)
 Spoke with Dr. Marelyn' office. Labs for CBC/CMP/Vitmain D faxed to [phone] with our [facility] accocunt # [id number]. Spoke with patient. Advised her to complete labs at PCP office with her sister before OV on 01/17/24. She verbalized understanding.  Sherry Pennant, PharmD, MPH, BCPS, CPP Clinical Pharmacist [facility])

## 2023-12-23 LAB — CBC WITH DIFFERENTIAL/PLATELET
Absolute Lymphocytes: 2418 {cells}/uL (ref 850–3900)
Absolute Monocytes: 650 {cells}/uL (ref 200–950)
Basophils Absolute: 39 {cells}/uL (ref 0–200)
Basophils Relative: 0.6 %
Eosinophils Absolute: 98 {cells}/uL (ref 15–500)
Eosinophils Relative: 1.5 %
HCT: 38.9 % (ref 35.0–45.0)
Hemoglobin: 12.7 g/dL (ref 11.7–15.5)
MCH: 29.8 pg (ref 27.0–33.0)
MCHC: 32.6 g/dL (ref 32.0–36.0)
MCV: 91.3 fL (ref 80.0–100.0)
MPV: 10.4 fL (ref 7.5–12.5)
Monocytes Relative: 10 %
Neutro Abs: 3296 {cells}/uL (ref 1500–7800)
Neutrophils Relative %: 50.7 %
Platelets: 238 Thousand/uL (ref 140–400)
RBC: 4.26 Million/uL (ref 3.80–5.10)
RDW: 14.2 % (ref 11.0–15.0)
Total Lymphocyte: 37.2 %
WBC: 6.5 Thousand/uL (ref 3.8–10.8)

## 2023-12-23 LAB — COMPREHENSIVE METABOLIC PANEL WITH GFR
AG Ratio: 1.7 (calc) (ref 1.0–2.5)
ALT: 9 U/L (ref 6–29)
AST: 14 U/L (ref 10–35)
Albumin: 4.3 g/dL (ref 3.6–5.1)
Alkaline phosphatase (APISO): 39 U/L (ref 37–153)
BUN: 15 mg/dL (ref 7–25)
CO2: 25 mmol/L (ref 20–32)
Calcium: 10 mg/dL (ref 8.6–10.4)
Chloride: 106 mmol/L (ref 98–110)
Creat: 0.92 mg/dL (ref 0.60–1.00)
Globulin: 2.5 g/dL (ref 1.9–3.7)
Glucose, Bld: 154 mg/dL — ABNORMAL HIGH (ref 65–99)
Potassium: 4.5 mmol/L (ref 3.5–5.3)
Sodium: 141 mmol/L (ref 135–146)
Total Bilirubin: 0.5 mg/dL (ref 0.2–1.2)
Total Protein: 6.8 g/dL (ref 6.1–8.1)
eGFR: 65 mL/min/1.73m2 (ref >=60)

## 2023-12-23 LAB — VITAMIN D 25 HYDROXY (VIT D DEFICIENCY, FRACTURES): Vit D, 25-Hydroxy: 64 ng/mL (ref 30–100)

## 2023-12-28 ENCOUNTER — Other Ambulatory Visit: Payer: Self-pay

## 2023-12-28 NOTE — Progress Notes (Signed)
 Specialty Pharmacy Refill Coordination Note  Cathy Haney is a 73 y.o. female assessed today regarding refills of clinic administered specialty medication(s) Denosumab -bbdz (Jubbonti )   Clinic requested Courier to Provider Office   Delivery date: 01/09/24   Verified address: Rheum: [address], [address], [place] [address].   Medication will be filled on 01/06/24.    Appointment 01/17/24.

## 2023-12-29 MED ORDER — DENOSUMAB-BBDZ 60 MG/ML ~~LOC~~ SOSY
60.0000 mg | PREFILLED_SYRINGE | Freq: Once | SUBCUTANEOUS | Status: AC
  Administered 2024-01-17: 60 mg via SUBCUTANEOUS

## 2023-12-29 NOTE — Telephone Encounter (Signed)
 CBC/CMP/Vitamin on 12/22/23 stable. Okay to proceed with Jubbontin on 01/17/2024

## 2024-01-03 NOTE — Progress Notes (Unsigned)
 Office Visit Note  Patient: Cathy Haney             Date of Birth: 07/13/1950           MRN: [id number]             PCP: Marelyn Quill, MD Referring: Marelyn Quill, MD Visit Date: 01/17/2024   Subjective:   History of Present Illness: Cathy Haney is a 73 y.o. female here for follow up for osteoporosis on prolia  and with osteoarthritis of multiple joints.  Overall she is doing well without any significant major health changes.  She is experiencing some pain along the sided chest wall area around her left side along the lower border of the ribs.  There is no significant impact or injury to the site.  Tenderness is mostly with pressure and sometimes with certain movements not provoked by deep inspiration or posture.  She had interval labs checked on September 25 with vitamin D  CBC and CMP all in the normal range except for some hyperglycemia.  Previous HPI 07/18/2023 Cathy Haney is a 73 y.o. female here for follow up for osteoporosis on prolia  and with osteoarthritis of multiple joints.   She has a history of carpal tunnel syndrome with previous surgical intervention on both arms. There is a hard, itchy area on her arm where the surgery was performed. She experiences arthritis, particularly in her left thumb, but no significant joint pain, swelling, or balance problems that are worse than usual.   She experiences trigger finger symptoms, particularly in her right index finger and left middle finger. She describes having to 'pull on something' and sometimes the finger will 'lock down,' requiring her to push it back up quickly. She performs hand stretching exercises every night to manage the symptoms.   She has received Prolia  injections for osteoporosis and is due for a bone density test to evaluate the effectiveness of the treatment. She recalls having a bone density test earlier this year.   No recent serious illnesses, regular cough, eye irritation, or rashes. She uses  antihistamines as needed during pollen season, but not frequently.     Previous HPI 01/18/2023 Cathy Haney is a 73 y.o. female here for follow up for osteoporosis on prolia  and with osteoarthritis of multiple joints.  Overall is feeling well has not suffered any new falls or injury.  She is not able to do a lot of weightbearing exercise due to significant knee arthritis treated with viscosupplementation injections she usually relies on a walker or leaning on a cart while away from home.  Is on gabapentin  300 mg 5 times daily not requiring other prescription medicines for arthritis.  She had recent blood count metabolic panel labs drawn which were fine.   Previous HPI 06/23/2022 Cathy Haney is a 73 y.o. female here for follow up for osteoporosis now on Prolia  treatment with dose scheduled today also to discuss some osteoarthritis symptoms.  Prolia  treatment second dose today she did not have any complications with the first medication start.  Has not sustained any new falls over the past 6 months.  She is taking supplemental calcium  and vitamin D  as directed.  Recent labs were obtained including metabolic panel and vitamin D  with calcium  estimated GFR and vitamin D  in goal range.  She is using a rolling walker when walking outside of the home most times.  Has some difficulty with transporting this and just uses the cart or buggy as assistance most times  while shopping.  She is to continue to follow-up with orthopedic surgery as discussed the treatment options for advanced knee osteoarthritis including possible viscosupplementation injections due to limited benefit duration with the steroid shots but recommending against surgeries if possible.   She is here today with her sister.   Previous HPI 12/07/21 Cathy Haney is a 73 y.o. female here for osteoporosis currently treated with fosamax but with lack of improvement or possible progressive decline on repeat bone densitometry.  She is not exactly sure  how long she is taking the Fosamax medication but has been for several years and reports good adherence to this.  She has no history of any bone fractures.  She has hypothyroidism stable on thyroid replacement medication and is a former cigarette smoker.  She denies any history of kidney stones.  She denies any long-term use of corticosteroids.  She has not fallen within the past 2 years.  She is taking a vitamin D3 supplement daily appears to be 2000 units. Besides the osteoporosis she does also complain of some joint pain symptoms as well.  She has chronic low back pain that is aggravated with prolonged standing or walking and radiation of symptoms into the left leg.  This occurs within less than a few 100 feet.  Symptoms are alleviated by sitting or standing with a support for forward bending such as a counter walker or cart.  She denies any numbness or loss of sensation in her legs or feet. She does experience numbness and tingling symptoms in the left hand.  She had similar problems in her right hand with previous carpal tunnel release surgery to resolve this.  She has been performing some of the range of motion exercises for this.   Her daughter is present at our visit today.   DEXA reviewed DualFemur Total Left 06/11/21            -1.7      0.798 03/19/20          -1.3      0.844 02/27/2018        -1.7      0.793 02/25/16          -2.0      0.755 01/19/02          -1.6      0.818   DualFemur Mean 06/11/21            -1.4      0.837 03/19/20          -1.1      0.868 02/27/18            -1.5      0.823 02/25/16          -1.9      0.764 01/19/02          -1.4      0.830   Left forearm Radius 06/11/21            -3.1      0.606 03/19/20          -2.9      0.626 02/27/18            -2.9      0.621   Review of Systems  Constitutional:  Positive for fatigue.  HENT:  Negative for mouth sores and mouth dryness.   Eyes:  Negative for dryness.  Respiratory:  Positive for shortness of breath.    Cardiovascular:  Negative for chest pain and palpitations.  Gastrointestinal:  Positive for diarrhea. Negative for blood in stool and constipation.  Endocrine: Negative for increased urination.  Genitourinary:  Negative for involuntary urination.  Musculoskeletal:  Negative for joint pain, gait problem, joint pain, joint swelling, myalgias, muscle weakness, morning stiffness, muscle tenderness and myalgias.  Skin:  Negative for color change, rash, hair loss and sensitivity to sunlight.  Allergic/Immunologic: Negative for susceptible to infections.  Neurological:  Negative for dizziness and headaches.  Hematological:  Negative for swollen glands.  Psychiatric/Behavioral:  Positive for sleep disturbance. Negative for depressed mood. The patient is not nervous/anxious.     PMFS History:  Patient Active Problem List   Diagnosis Date Noted   Generalized osteoarthritis of multiple sites 06/23/2022   Age-related osteoporosis without current pathological fracture 12/07/2021   Vitamin D  deficiency 12/07/2021   Pre-diabetes    Pneumonia    Hypothyroidism    Dyspnea    Depression    Arthritis    Anxiety    Anemia    Allergy    Acquired hypothyroidism 07/06/2019   CAD (coronary artery disease) 06/27/2019   Coronary artery calcification seen on CT scan    Shortness of breath 06/19/2019   Mixed hyperlipidemia 06/19/2019   Prediabetes 06/19/2019   Obesity (BMI 30-39.9) 06/19/2019   AAA (abdominal aortic aneurysm) without rupture 06/19/2019   Preoperative cardiovascular examination 06/01/2017   Diverticulitis 05/30/2017   Thyroid disease 05/30/2017   Benign hypertension 05/30/2017   Aortic aneurysm of unspecified site, without rupture 05/30/2017   Benign essential hypertension 05/30/2017   Abdominal aortic aneurysm 05/27/2017    Past Medical History:  Diagnosis Date   AAA (abdominal aortic aneurysm) without rupture 06/19/2019   Abdominal aortic aneurysm 05/27/2017   Acquired  hypothyroidism 07/06/2019   Age-related osteoporosis without current pathological fracture 12/07/2021   Allergy    Anemia    during pregnancy   Anxiety    hx   Aortic aneurysm of unspecified site, without rupture 05/30/2017   Arthritis    Benign essential hypertension 05/30/2017   Benign hypertension 05/30/2017   CAD (coronary artery disease) 06/27/2019   Coronary artery calcification seen on CT scan    Depression    hx has meds as needed   Diverticulitis    Dyspnea    Generalized osteoarthritis of multiple sites 06/23/2022   Hypothyroidism    Aquired   Mixed hyperlipidemia    Obesity (BMI 30-39.9) 06/19/2019   Pneumonia    in the past   Pre-diabetes    diet controlled, doesnt eat sweets   Prediabetes    Preoperative cardiovascular examination 06/01/2017   Shortness of breath 06/19/2019   Thyroid disease    Vitamin D  deficiency 12/07/2021    Family History  Problem Relation Age of Onset   Heart disease Mother    Heart disease Father    Diabetes Father    Colon cancer Sister    Arthritis Brother    Aneurysm Brother    Heart attack Brother    Diabetes Brother    Heart disease Brother    Diverticulitis Son    Past Surgical History:  Procedure Laterality Date   ABDOMINAL AORTA STENT  07/05/2019   ABDOMINAL AORTIC ENDOVASCULAR STENT GRAFT (N/A Groin)    ABDOMINAL AORTIC ENDOVASCULAR STENT GRAFT N/A 07/05/2019   Procedure: ABDOMINAL AORTIC ENDOVASCULAR STENT GRAFT;  Surgeon: Sheree Penne Bruckner, MD;  Location: [facility] OR;  Service: Vascular;  Laterality: N/A;   BACK SURGERY     khyphoplasty   CARPAL TUNNEL RELEASE  CARPAL TUNNEL RELEASE Left 11/26/2022   COLONOSCOPY     ENDARTERECTOMY FEMORAL  07/05/2019   Procedure: Right Endarterectomy Femoral;  Surgeon: Sheree Penne Bruckner, MD;  Location: [facility] OR;  Service: Vascular;;   EXCISION OF SKIN LESION     left arm   LEFT HEART CATH AND CORONARY ANGIOGRAPHY N/A 06/27/2019   Procedure: LEFT HEART CATH AND  CORONARY ANGIOGRAPHY;  Surgeon: Claudene Victory ORN, MD;  Location: [facility] INVASIVE CV LAB;  Service: Cardiovascular;  Laterality: N/A;   TOE SURGERY     nails removed   TONSILLECTOMY     Social History   Social History Narrative   Not on file    There is no immunization history on file for this patient.   Objective: Vital Signs: BP (!) 155/69   Pulse 72   Temp (!) 97.2 F (36.2 C)   Resp 16   Ht 5' 4.6 (1.641 m)   Wt 190 lb 6.4 oz (86.4 kg)   BMI 32.08 kg/m    Physical Exam Eyes:     Conjunctiva/sclera: Conjunctivae normal.  Cardiovascular:     Rate and Rhythm: Normal rate and regular rhythm.  Pulmonary:     Effort: Pulmonary effort is normal.     Breath sounds: Normal breath sounds.  Musculoskeletal:     Right lower leg: No edema.     Left lower leg: No edema.  Skin:    General: Skin is warm and dry.  Neurological:     Mental Status: She is alert.  Psychiatric:        Mood and Affect: Mood normal.      Musculoskeletal Exam:  Shoulders full ROM no tenderness or swelling Elbows full ROM no tenderness or swelling Wrists full ROM no tenderness or swelling Fingers full ROM no tenderness or swelling Heberden's nodes present on both hands worse distally Tenderness to pressure along lower border of ribs extending to around midaxillary line, no epigastric pain, no radiation No paraspinal tenderness to palpation over upper and lower back Hip normal internal and external rotation without pain, no tenderness to lateral hip palpation Knees full ROM minimal tenderness to pressure and no palpable effusions, patellofemoral crepitus bilaterally  Investigation: No additional findings.  Imaging: VAS US  EVAR DUPLEX Result Date: 01/18/2024 Endovascular Aortic Repair Study (EVAR) Patient Name:  EVOLA HOLLIS  Date of Exam:   01/18/2024 Medical Rec #: [id number]     Accession #:    [id number] Date of Birth: 1951-03-25     Patient Gender: F Patient Age:   64 years Exam Location:  [address] Procedure:      VAS US  EVAR DUPLEX Referring Phys: PENNE SHEREE --------------------------------------------------------------------------------  Indications: AAA without rupture Risk Factors: Hypertension, hyperlipidemia, past history of smoking, coronary               artery disease.  Comparison Study: 12/01/22                   Abdominal Aorta: Patent endovascular aneurysm repair with no                   evidence of                   endoleak. Performing Technologist: Dena Pane  Examination Guidelines: A complete evaluation includes B-mode imaging, spectral Doppler, color Doppler, and power Doppler as needed of all accessible portions of each vessel. Bilateral testing is considered an integral part of a complete examination. Limited examinations  for reoccurring indications may be performed as noted.  Endovascular Aortic Repair (EVAR): +----------+----------------+-------------------+-------------------+           Diameter AP (cm)Diameter Trans (cm)Velocities (cm/sec) +----------+----------------+-------------------+-------------------+ Aorta     3.50            4.50               27                  +----------+----------------+-------------------+-------------------+ Right Limb1.60            1.70               34                  +----------+----------------+-------------------+-------------------+ Left Limb 1.30            1.40               57                  +----------+----------------+-------------------+-------------------+  Summary: Abdominal Aorta: Patent endovascular aneurysm repair with no evidence of endoleak. IVC/Iliac: There is no evidence of thrombus involving the IVC.  *See table(s) above for measurements and observations.  Electronically signed by Penne Colorado MD on 01/18/2024 at 1:11:09 PM.    Final    VAS US  ABI WITH/WO TBI Result Date: 01/18/2024  LOWER EXTREMITY DOPPLER STUDY Patient Name:  LAKELA KUBA  Date of Exam:   01/18/2024 Medical Rec #: [id number]      Accession #:    [id number] Date of Birth: 06-02-1950     Patient Gender: F Patient Age:   53 years Exam Location:  [address] Procedure:      VAS US  ABI WITH/WO TBI Referring Phys: PENNE COLORADO --------------------------------------------------------------------------------  Indications: Peripheral artery disease. High Risk Factors: Hypertension, hyperlipidemia, past history of smoking,                    coronary artery disease.  Comparison Study: 12/01/22                   right: 0.87                   left: 0.52 Performing Technologist: Dena Pane  Examination Guidelines: A complete evaluation includes at minimum, Doppler waveform signals and systolic blood pressure reading at the level of bilateral brachial, anterior tibial, and posterior tibial arteries, when vessel segments are accessible. Bilateral testing is considered an integral part of a complete examination. Photoelectric Plethysmograph (PPG) waveforms and toe systolic pressure readings are included as required and additional duplex testing as needed. Limited examinations for reoccurring indications may be performed as noted.  ABI Findings: +---------+------------------+-----+-----------+--------+ Right    Rt Pressure (mmHg)IndexWaveform   Comment  +---------+------------------+-----+-----------+--------+ Brachial 178                    triphasic           +---------+------------------+-----+-----------+--------+ PTA      107               0.60 multiphasic         +---------+------------------+-----+-----------+--------+ DP       102               0.57 multiphasic         +---------+------------------+-----+-----------+--------+ Great Toe63                0.35                     +---------+------------------+-----+-----------+--------+ +--------+------------------+-----+-------------------+-----------------+  Left    Lt Pressure (mmHg)IndexWaveform           Comment            +--------+------------------+-----+-------------------+-----------------+ Brachial172                    triphasic                            +--------+------------------+-----+-------------------+-----------------+ PTA     58                0.33 dampened monophasic                  +--------+------------------+-----+-------------------+-----------------+ DP                                                possibly occluded +--------+------------------+-----+-------------------+-----------------+ +-------+-----------+-----------+------------+------------+ ABI/TBIToday's ABIToday's TBIPrevious ABIPrevious TBI +-------+-----------+-----------+------------+------------+ Right  0.60       0.35       0.87        0.62         +-------+-----------+-----------+------------+------------+ Left   0.33       absent     0.52        0.23         +-------+-----------+-----------+------------+------------+  Bilateral ABIs appear decreased. Right TBIs appear essentially unchanged.  Summary: Right: Resting right ankle-brachial index indicates moderate right lower extremity arterial disease. The right toe-brachial index is abnormal.  Left: Resting left ankle-brachial index indicates severe left lower extremity arterial disease. Unable to obtain left DPA/ATA and great toe waveform/pressure. Vessels are most likely occluded. *See table(s) above for measurements and observations.  Electronically signed by Penne Colorado MD on 01/18/2024 at 11:13:27 AM.    Final     Recent Labs: Lab Results  Component Value Date   WBC 6.5 12/22/2023   HGB 12.7 12/22/2023   PLT 238 12/22/2023   NA 141 12/22/2023   K 4.5 12/22/2023   CL 106 12/22/2023   CO2 25 12/22/2023   GLUCOSE 154 (H) 12/22/2023   BUN 15 12/22/2023   CREATININE 0.92 12/22/2023   BILITOT 0.5 12/22/2023   ALKPHOS 51 07/03/2019   AST 14 12/22/2023   ALT 9 12/22/2023   PROT 6.8 12/22/2023   ALBUMIN 4.0 07/03/2019   CALCIUM  10.0  12/22/2023   GFRAA >60 07/06/2019    Speciality Comments: No specialty comments available.  Procedures:  No procedures performed Allergies: Patient has no known allergies.   Assessment / Plan:     Visit Diagnoses: Age-related osteoporosis without current pathological fracture Receiving Prolia  injections; bone density test planned to monitor treatment. - Administering Prolia  injection today. - Order bone density test to assess the effectiveness of Prolia  treatment. [facility])  Carpal tunnel syndrome Post-surgical site on left arm.  Doing better without active complaint now.  Chest wall pain No red flags on exam for cardiac or pleural etiology.  Symptoms are localized along the lower portion of the ribs suspect some type of intercostal muscle strain so far gradually improving compared to onset without specific intervention.  Orders: No orders of the defined types were placed in this encounter.  No orders of the defined types were placed in this encounter.    Follow-Up Instructions: Return in about 6 months (around 07/17/2024) for OA/OP on denosumab  f/u 6mos.  Lonni LELON Ester, MD  Note - This record has been created using [employer].  Chart creation errors have been sought, but may not always  have been located. Such creation errors do not reflect on  the standard of medical care.

## 2024-01-06 ENCOUNTER — Other Ambulatory Visit: Payer: Self-pay

## 2024-01-13 NOTE — Progress Notes (Signed)
 Pharmacy Note  Subjective:   Patient presents to clinic today to receive bi-annual dose of denosumab . Patient's last dose of denosumab  was on 07/18/2023  Patient running a fever or have signs/symptoms of infection? No  Patient currently on antibiotics for the treatment of infection? No  Patient had fall in the last 6 months?  No    Patient taking calcium  1200 mg daily through diet or supplement and at least 800 units vitamin D ? Yes  Objective: CMP     Component Value Date/Time   NA 141 12/22/2023 0000   NA 137 06/22/2019 0942   K 4.5 12/22/2023 0000   CL 106 12/22/2023 0000   CO2 25 12/22/2023 0000   GLUCOSE 154 (H) 12/22/2023 0000   BUN 15 12/22/2023 0000   BUN 13 06/22/2019 0942   CREATININE 0.92 12/22/2023 0000   CALCIUM  10.0 12/22/2023 0000   PROT 6.8 12/22/2023 0000   ALBUMIN 4.0 07/03/2019 1135   AST 14 12/22/2023 0000   ALT 9 12/22/2023 0000   ALKPHOS 51 07/03/2019 1135   BILITOT 0.5 12/22/2023 0000   GFRNONAA >60 07/06/2019 0434   GFRAA >60 07/06/2019 0434    CBC    Component Value Date/Time   WBC 6.5 12/22/2023 0000   RBC 4.26 12/22/2023 0000   HGB 12.7 12/22/2023 0000   HGB 12.1 06/22/2019 0942   HCT 38.9 12/22/2023 0000   HCT 36.5 06/22/2019 0942   PLT 238 12/22/2023 0000   PLT 227 06/22/2019 0942   MCV 91.3 12/22/2023 0000   MCV 94 06/22/2019 0942   MCH 29.8 12/22/2023 0000   MCHC 32.6 12/22/2023 0000   RDW 14.2 12/22/2023 0000   RDW 13.2 06/22/2019 0942   LYMPHSABS 2,125 01/05/2023 1121   EOSABS 98 12/22/2023 0000   BASOSABS 39 12/22/2023 0000    Lab Results  Component Value Date   VD25OH 64 12/22/2023   DualFemur Total Left 06/11/21            -1.7      0.798 03/19/20          -1.3      0.844 02/27/2018        -1.7      0.793 02/25/16          -2.0      0.755 01/19/02          -1.6      0.818   DualFemur Mean 06/11/21            -1.4      0.837 03/19/20          -1.1      0.868 02/27/18            -1.5      0.823 02/25/16           -1.9      0.764 01/19/02          -1.4      0.830   Left forearm Radius 06/11/21            -3.1      0.606 03/19/20          -2.9      0.626 02/27/18            -2.9      0.621  Assessment/Plan:   Reviewed importance of adequate dietary intake of calcium  in addition to supplementation due to risk of hypocalcemia with denosumab .   Patient tolerated injection well.   Administration  details as below: Administrations This Visit     denosumab -bbdz SOSY 60 mg     Admin Date 01/17/2024 Action Given Dose 60 mg Route Subcutaneous Documented By Dayne Sherry RAMAN, RPH-CPP           Patient's next denosumab  dose is due on 07/15/2024.  Patient is due for updated DEXA. Was ordered and sent to [facility] in October 2024   All questions encouraged and answered.  Instructed patient to call with any further questions or concerns.   Sherry Dayne, PharmD, MPH, BCPS, CPP Clinical Pharmacist [facility])

## 2024-01-17 ENCOUNTER — Encounter: Payer: Self-pay | Admitting: Internal Medicine

## 2024-01-17 ENCOUNTER — Ambulatory Visit: Attending: Internal Medicine | Admitting: Internal Medicine

## 2024-01-17 VITALS — BP 155/69 | HR 72 | Temp 97.2°F | Resp 16 | Ht 64.6 in | Wt 190.4 lb

## 2024-01-17 DIAGNOSIS — M810 Age-related osteoporosis without current pathological fracture: Secondary | ICD-10-CM

## 2024-01-17 DIAGNOSIS — M1990 Unspecified osteoarthritis, unspecified site: Secondary | ICD-10-CM

## 2024-01-17 NOTE — Progress Notes (Signed)
 I called patient, patient will call [facility] to schedule DEXA, order faxed, [facility] will call patient to schedule appt.

## 2024-01-18 ENCOUNTER — Ambulatory Visit

## 2024-01-18 ENCOUNTER — Ambulatory Visit (HOSPITAL_COMMUNITY)
Admission: RE | Admit: 2024-01-18 | Discharge: 2024-01-18 | Disposition: A | Discharge status: Home / Self Care | Source: Ambulatory Visit | Attending: Vascular Surgery | Admitting: Vascular Surgery

## 2024-01-18 ENCOUNTER — Ambulatory Visit (HOSPITAL_BASED_OUTPATIENT_CLINIC_OR_DEPARTMENT_OTHER)
Admission: RE | Admit: 2024-01-18 | Discharge: 2024-01-18 | Disposition: A | Discharge status: Home / Self Care | Source: Ambulatory Visit

## 2024-01-18 VITALS — BP 183/81 | HR 79 | Temp 97.7°F | Wt 188.3 lb

## 2024-01-18 DIAGNOSIS — I739 Peripheral vascular disease, unspecified: Secondary | ICD-10-CM

## 2024-01-18 DIAGNOSIS — I714 Abdominal aortic aneurysm, without rupture, unspecified: Secondary | ICD-10-CM | POA: Diagnosis present

## 2024-01-18 LAB — VAS US ABI WITH/WO TBI
Left ABI: 0.33
Right ABI: 0.6

## 2024-01-18 NOTE — Progress Notes (Signed)
 Office Note     CC:  follow up Requesting Provider:  Marelyn Quill, MD  HPI: Cathy Haney is a 73 y.o. ([birth year]) female who presents for evaluation of abdominal aortic aneurysm.  She underwent endovascular repair on/8/21 by Dr. Sheree.  She denies any new or changing abdominal or back pain.  She is also followed for PAD with known left SFA occlusion.  She has claudication symptoms in her left calf when walking long distances however she is able to shop in the grocery store without having to stop.  She denies any wounds or rest pain in bilateral lower extremities.  She is a former smoker.  She takes a daily aspirin  and statin.   Past Medical History:  Diagnosis Date   AAA (abdominal aortic aneurysm) without rupture 06/19/2019   Abdominal aortic aneurysm 05/27/2017   Acquired hypothyroidism 07/06/2019   Age-related osteoporosis without current pathological fracture 12/07/2021   Allergy    Anemia    during pregnancy   Anxiety    hx   Aortic aneurysm of unspecified site, without rupture 05/30/2017   Arthritis    Benign essential hypertension 05/30/2017   Benign hypertension 05/30/2017   CAD (coronary artery disease) 06/27/2019   Coronary artery calcification seen on CT scan    Depression    hx has meds as needed   Diverticulitis    Dyspnea    Generalized osteoarthritis of multiple sites 06/23/2022   Hypothyroidism    Aquired   Mixed hyperlipidemia    Obesity (BMI 30-39.9) 06/19/2019   Pneumonia    in the past   Pre-diabetes    diet controlled, doesnt eat sweets   Prediabetes    Preoperative cardiovascular examination 06/01/2017   Shortness of breath 06/19/2019   Thyroid disease    Vitamin D  deficiency 12/07/2021    Past Surgical History:  Procedure Laterality Date   ABDOMINAL AORTA STENT  07/05/2019   ABDOMINAL AORTIC ENDOVASCULAR STENT GRAFT (N/A Groin)    ABDOMINAL AORTIC ENDOVASCULAR STENT GRAFT N/A 07/05/2019   Procedure: ABDOMINAL AORTIC ENDOVASCULAR STENT  GRAFT;  Surgeon: Sheree Penne Bruckner, MD;  Location: [facility] OR;  Service: Vascular;  Laterality: N/A;   BACK SURGERY     khyphoplasty   CARPAL TUNNEL RELEASE     CARPAL TUNNEL RELEASE Left 11/26/2022   COLONOSCOPY     ENDARTERECTOMY FEMORAL  07/05/2019   Procedure: Right Endarterectomy Femoral;  Surgeon: Sheree Penne Bruckner, MD;  Location: [facility] OR;  Service: Vascular;;   EXCISION OF SKIN LESION     left arm   LEFT HEART CATH AND CORONARY ANGIOGRAPHY N/A 06/27/2019   Procedure: LEFT HEART CATH AND CORONARY ANGIOGRAPHY;  Surgeon: Claudene Victory ORN, MD;  Location: [facility] INVASIVE CV LAB;  Service: Cardiovascular;  Laterality: N/A;   TOE SURGERY     nails removed   TONSILLECTOMY      Social History   Socioeconomic History   Marital status: Widowed    Spouse name: Not on file   Number of children: Not on file   Years of education: Not on file   Highest education level: Not on file  Occupational History   Not on file  Tobacco Use   Smoking status: Former    Current packs/day: 0.00    Average packs/day: 0.5 packs/day for 50.0 years (25.0 ttl pk-yrs)    Types: Cigarettes    Start date: 03/29/1969    Quit date: 03/30/2019    Years since quitting: 4.8    Passive exposure: Never  Smokeless tobacco: Never  Vaping Use   Vaping status: Never Used  Substance and Sexual Activity   Alcohol use: No   Drug use: No   Sexual activity: Not Currently  Other Topics Concern   Not on file  Social History Narrative   Not on file   Social Drivers of Health   Financial Resource Strain: Not on file  Food Insecurity: Not on file  Transportation Needs: Not on file  Physical Activity: Not on file  Stress: Not on file  Social Connections: Not on file  Intimate Partner Violence: Not on file    Family History  Problem Relation Age of Onset   Heart disease Mother    Heart disease Father    Diabetes Father    Colon cancer Sister    Arthritis Brother    Aneurysm Brother    Heart attack  Brother    Diabetes Brother    Heart disease Brother    Diverticulitis Son     Current Outpatient Medications  Medication Sig Dispense Refill   albuterol  (VENTOLIN  HFA) 108 (90 Base) MCG/ACT inhaler Inhale into the lungs as needed for wheezing or shortness of breath.      aspirin  EC 81 MG tablet Take 81 mg by mouth daily.     Blood Glucose Monitoring Suppl (ACCU-CHEK GUIDE ME) w/Device KIT USE AS DIRECTED TO CHECK BLOOD SUGAR     Calcium  Carb-Cholecalciferol (CALCIUM  600/VITAMIN D3 PO) Take 1 tablet by mouth daily.     carvedilol  (COREG ) 6.25 MG tablet Take 6.25 mg by mouth 2 (two) times daily with a meal.      Cholecalciferol (VITAMIN D3) 50 MCG (2000 UT) TABS Take 2,000 Units by mouth every evening.     clopidogrel  (PLAVIX ) 75 MG tablet TAKE 1 TABLET BY MOUTH ONCE DAILY AT 6 AM 30 tablet 6   denosumab -bbdz (JUBBONTI ) 60 MG/ML SOSY Inject 60 mg into the skin every 6 (six) months. Courier to rheum: [address], [place] [address]. Appt on 01/17/24 1 mL 0   FARXIGA 10 MG TABS tablet Take 1 tablet every day by oral route with meals.     Fish Oil-Cholecalciferol (OMEGA-3 + VITAMIN D3 PO) Take 1 tablet by mouth daily at 12 noon.     fluticasone (FLONASE) 50 MCG/ACT nasal spray Place 1 spray into both nostrils daily as needed for rhinitis (runny nose/allergies.). (Patient not taking: Reported on 01/17/2024)     gabapentin  (NEURONTIN ) 300 MG capsule Take 300 mg by mouth 5 (five) times daily.      glucose blood test strip Accu-Chek Aviva Plus test strips  Use to check BS daily.     levothyroxine  (SYNTHROID , LEVOTHROID) 125 MCG tablet Take 125 mcg by mouth daily before breakfast.     LINZESS 72 MCG capsule Take 72 mcg by mouth daily. (Patient taking differently: Take 72 mcg by mouth as needed.)     lisinopril  (PRINIVIL ,ZESTRIL ) 10 MG tablet Take 10 mg by mouth daily.     metFORMIN (GLUCOPHAGE) 500 MG tablet Take 500 mg by mouth daily.     MOUNJARO 2.5 MG/0.5ML Pen Inject 2.5 mg  into the skin once a week.     Multiple Vitamins-Minerals (PRESERVISION AREDS 2 PO) Take 1 tablet by mouth in the morning and at bedtime.     rosuvastatin  (CRESTOR ) 40 MG tablet Take 1 tablet (40 mg total) by mouth daily. 90 tablet 3   No current facility-administered medications for this visit.    No Known  Allergies   REVIEW OF SYSTEMS:  Negative unless noted in HPI [X]  denotes positive finding, [ ]  denotes negative finding Cardiac  Comments:  Chest pain or chest pressure:    Shortness of breath upon exertion:    Short of breath when lying flat:    Irregular heart rhythm:        Vascular    Pain in calf, thigh, or hip brought on by ambulation:    Pain in feet at night that wakes you up from your sleep:     Blood clot in your veins:    Leg swelling:         Pulmonary    Oxygen at home:    Productive cough:     Wheezing:         Neurologic    Sudden weakness in arms or legs:     Sudden numbness in arms or legs:     Sudden onset of difficulty speaking or slurred speech:    Temporary loss of vision in one eye:     Problems with dizziness:         Gastrointestinal    Blood in stool:     Vomited blood:         Genitourinary    Burning when urinating:     Blood in urine:        Psychiatric    Major depression:         Hematologic    Bleeding problems:    Problems with blood clotting too easily:        Skin    Rashes or ulcers:        Constitutional    Fever or chills:      PHYSICAL EXAMINATION:  Vitals:   01/18/24 1113  BP: (!) 183/81  Pulse: 79  Temp: 97.7 F (36.5 C)  TempSrc: Temporal  Weight: 188 lb 4.8 oz (85.4 kg)    General:  WDWN in NAD; vital signs documented above Gait: Not observed HENT: WNL, normocephalic Pulmonary: normal non-labored breathing Cardiac: regular HR Abdomen: soft, NT, no masses Skin: without rashes Vascular Exam/Pulses: absent pedal pulses Extremities: without ischemic changes, without Gangrene , without cellulitis;  without open wounds;  Musculoskeletal: no muscle wasting or atrophy  Neurologic: A&O X 3 Psychiatric:  The pt has Normal affect.   Non-Invasive Vascular Imaging:   4.5 cm aneurysmal sac on duplex; this has increased from 3.8 cm last year.  No endoleak is noted  ABI/TBIToday's ABIToday's TBIPrevious ABIPrevious TBI  +-------+-----------+-----------+------------+------------+  Right 0.60       0.35       0.87        0.62          +-------+-----------+-----------+------------+------------+  Left  0.33       absent     0.52        0.23              ASSESSMENT/PLAN:: 73 y.o. female here for follow up for surveillance of EVAR and PAD  Cathy Haney underwent endovascular repair of abdominal aortic aneurysm in 2021 by Dr. Sheree.  AAA sac increased in size since last year however no endoleak was noted.  We will repeat EVAR duplex in 6 months.  If at that time the sac continues to increase in size we will check a CTA abdomen and pelvis.  She is also followed for PAD with known left SFA occlusion.  She has claudication symptoms of the left calf however these  are tolerable.  I encouraged her to walk for exercise.  She will continue her aspirin  and statin daily.  She does not have rest pain or tissue loss.  We will repeat the ABI in 6 months.   Donnice Sender, PA-C [facility] [phone]  Clinic MD:   Sheree

## 2024-01-19 ENCOUNTER — Other Ambulatory Visit: Payer: Self-pay | Admitting: *Deleted

## 2024-01-19 DIAGNOSIS — I739 Peripheral vascular disease, unspecified: Secondary | ICD-10-CM

## 2024-01-19 DIAGNOSIS — Z9889 Other specified postprocedural states: Secondary | ICD-10-CM

## 2024-03-01 ENCOUNTER — Ambulatory Visit (INDEPENDENT_AMBULATORY_CARE_PROVIDER_SITE_OTHER): Admitting: Podiatry

## 2024-03-01 DIAGNOSIS — L909 Atrophic disorder of skin, unspecified: Secondary | ICD-10-CM

## 2024-03-01 DIAGNOSIS — I739 Peripheral vascular disease, unspecified: Secondary | ICD-10-CM

## 2024-03-01 DIAGNOSIS — M79605 Pain in left leg: Secondary | ICD-10-CM | POA: Diagnosis not present

## 2024-03-01 DIAGNOSIS — M79672 Pain in left foot: Secondary | ICD-10-CM

## 2024-03-01 NOTE — Progress Notes (Unsigned)
 Chief Complaint  Patient presents with   Foot Pain    Left foot pain, 49yrs been having pain and burning, when walking and at night she has the pain. Saw another doctor for it when it started, no tx from that doctor (exercises/ rest/ice and elevate) Diabetic    Discussed the use of AI scribe software for clinical note transcription with the patient, who gave verbal consent to proceed.  History of Present Illness ROSALINA DINGWALL is a 73 year old female who presents with left foot pain and numbness.  She experiences burning and severe pain in her left foot, particularly on the lateral side of the ankle and the bottom part of the heel. The pain sometimes radiates from the calf down to the foot and becomes numb at night. This has been ongoing for two to three years without improvement.  She states that it has been getting worse over the past few months  The pain is exacerbated by walking short distances, necessitating frequent stops, after which the pain temporarily subsides. She denies any recent injury to the area and reports no cramping in the calf. She has previously consulted a foot doctor but did not find relief, or obtain treatment, for the symptoms.  Her social history includes a past history of smoking, although she has not smoked in years. She typically wears [employer]chers shoes, which are comfortable and have plenty of room (but no support), and she wears a size eight and a half. No symptoms are reported in the right foot.   Past Medical History:  Diagnosis Date   AAA (abdominal aortic aneurysm) without rupture 06/19/2019   Abdominal aortic aneurysm 05/27/2017   Acquired hypothyroidism 07/06/2019   Age-related osteoporosis without current pathological fracture 12/07/2021   Allergy    Anemia    during pregnancy   Anxiety    hx   Aortic aneurysm of unspecified site, without rupture 05/30/2017   Arthritis    Benign essential hypertension 05/30/2017   Benign hypertension 05/30/2017    CAD (coronary artery disease) 06/27/2019   Coronary artery calcification seen on CT scan    Depression    hx has meds as needed   Diverticulitis    Dyspnea    Generalized osteoarthritis of multiple sites 06/23/2022   Hypothyroidism    Aquired   Mixed hyperlipidemia    Obesity (BMI 30-39.9) 06/19/2019   Pneumonia    in the past   Pre-diabetes    diet controlled, doesnt eat sweets   Prediabetes    Preoperative cardiovascular examination 06/01/2017   Shortness of breath 06/19/2019   Thyroid disease    Vitamin D  deficiency 12/07/2021   Past Surgical History:  Procedure Laterality Date   ABDOMINAL AORTA STENT  07/05/2019   ABDOMINAL AORTIC ENDOVASCULAR STENT GRAFT (N/A Groin)    ABDOMINAL AORTIC ENDOVASCULAR STENT GRAFT N/A 07/05/2019   Procedure: ABDOMINAL AORTIC ENDOVASCULAR STENT GRAFT;  Surgeon: Sheree Penne Bruckner, MD;  Location: [facility] OR;  Service: Vascular;  Laterality: N/A;   BACK SURGERY     khyphoplasty   CARPAL TUNNEL RELEASE     CARPAL TUNNEL RELEASE Left 11/26/2022   COLONOSCOPY     ENDARTERECTOMY FEMORAL  07/05/2019   Procedure: Right Endarterectomy Femoral;  Surgeon: Sheree Penne Bruckner, MD;  Location: [facility] OR;  Service: Vascular;;   EXCISION OF SKIN LESION     left arm   LEFT HEART CATH AND CORONARY ANGIOGRAPHY N/A 06/27/2019   Procedure: LEFT HEART CATH AND CORONARY ANGIOGRAPHY;  Surgeon: Claudene Shove  W, MD;  Location: [facility] INVASIVE CV LAB;  Service: Cardiovascular;  Laterality: N/A;   TOE SURGERY     nails removed   TONSILLECTOMY     No Known Allergies  Physical Exam CARDIOVASCULAR: Diminished pulses in both feet, more pronounced on the left. MUSCULOSKELETAL: Tenderness on the lateral side of the left ankle. Reduced fat pad on the plantar aspect of the foot. Possible fibroma at the base of the heel.   01/18/24 vascular study results:      Bilateral ABIs appear decreased. Right TBIs appear essentially unchanged.    Summary:  Right: Resting  right ankle-brachial index indicates moderate right lower  extremity arterial disease. The right toe-brachial index is abnormal.    Left: Resting left ankle-brachial index indicates severe left lower  extremity arterial disease.  Unable to obtain left DPA/ATA and great toe waveform/pressure. Vessels are  most likely occluded.   Assessment/Plan of Care: 1. PVD (peripheral vascular disease) with claudication   2. Fat pad atrophy of foot   3. Pain in left foot   4. Leg pain, posterior, left     AMB REFERRAL TO VASCULAR SURGERY/CLINIC Assessment & Plan Left foot pain Chronic left foot pain for 2-3 years, exacerbated by walking, with burning sensation and numbness at night. Pain localized to the lateral side of the ankle and heel. Differential includes vascular issues due to calf pain requiring rest and improvement upon cessation of activity. Weak pulses bilaterally, more pronounced on the left, suggest possible vascular insufficiency. - Ordered noninvasive blood flow test to assess vascular status - Fitted for arch support inserts to provide cushioning and support - Prescribed compounded topical medication for symptomatic relief, to be used 2-3 times daily as needed  Peripheral vascular disease with intermittent claudication on the left Suspected peripheral vascular disease due to symptoms of calf pain requiring rest and weak pulses bilaterally, more pronounced on the left. Noninvasive blood flow test will help determine the extent of vascular insufficiency. - Ordered noninvasive blood flow test to assess vascular status - Will consider referral to vascular specialist if blood flow is significantly restricted - After discussion with the patient and further review of her medical record, it was discovered that she recently had vascular studies performed on 01/18/2024 indicating severe arterial occlusive disease on the left lower extremity.  Her ABI on the left is 0.33.  I will place a urgent  consult to the vascular surgeon for evaluation and treatment/revascularization options.  Fat pad atrophy of left foot Fat pad atrophy contributing to increased pressure and discomfort in the left foot. Current footwear lacks adequate cushioning. - Fitted for arch support inserts to provide cushioning and support      Cardelia Sassano DSABRA [place], DPM, FACFAS [facility]     [address] [place], [place] [address]                Office [phone]  Fax [phone]

## 2024-03-16 ENCOUNTER — Ambulatory Visit

## 2024-07-17 ENCOUNTER — Ambulatory Visit: Admitting: Internal Medicine

## 2024-07-18 ENCOUNTER — Ambulatory Visit

## 2024-07-18 ENCOUNTER — Ambulatory Visit (HOSPITAL_COMMUNITY)
